# Patient Record
Sex: Male | Born: 1960
Health system: Southern US, Community
[De-identification: ages and names within clinical notes are randomized; demographics above are authoritative.]

## PROBLEM LIST (undated history)

## (undated) DIAGNOSIS — I10 Essential (primary) hypertension: Secondary | ICD-10-CM

## (undated) DIAGNOSIS — I8010 Phlebitis and thrombophlebitis of unspecified femoral vein: Secondary | ICD-10-CM

## (undated) DIAGNOSIS — Z1211 Encounter for screening for malignant neoplasm of colon: Secondary | ICD-10-CM

## (undated) DIAGNOSIS — Z125 Encounter for screening for malignant neoplasm of prostate: Secondary | ICD-10-CM

## (undated) DIAGNOSIS — IMO0002 Reserved for concepts with insufficient information to code with codable children: Secondary | ICD-10-CM

## (undated) DIAGNOSIS — E7800 Pure hypercholesterolemia, unspecified: Secondary | ICD-10-CM

## (undated) DIAGNOSIS — K219 Gastro-esophageal reflux disease without esophagitis: Secondary | ICD-10-CM

## (undated) DIAGNOSIS — E119 Type 2 diabetes mellitus without complications: Secondary | ICD-10-CM

## (undated) DIAGNOSIS — Z794 Long term (current) use of insulin: Secondary | ICD-10-CM

## (undated) HISTORY — PX: ABDOMINAL SURGERY: SHX537

---

## 2010-11-03 NOTE — Procedures (Signed)
[facility]  *** FINAL REPORT ***    Name: FELIX, MERAS  MRN: [id number]  DOB: 02-15-1961  Visit: [id number]  Date: 28 Oct 2010    TYPE OF TEST: Peripheral Venous Testing    REASON FOR TEST  Limb swelling    Right Leg:-  Deep venous thrombosis:           Yes  Proximal extent of thrombus:      Popliteal Below The Knee  Superficial venous thrombosis:    No  Deep venous insufficiency:        No  Superficial venous insufficiency: No    Left Leg:-  Deep venous thrombosis:           Yes  Proximal extent of thrombus:      Mid Femoral  Superficial venous thrombosis:    No  Deep venous insufficiency:        No  Superficial venous insufficiency: No      INTERPRETATION/FINDINGS  Right leg :  1. Deep vein(s) visualized include the common femoral, proximal  femoral, mid femoral, distal femoral, popliteal(above knee),  popliteal(fossa), popliteal(below knee) and posterior tibial veins.  2.Chronic nonocclusive deep venous thrombosis identified in the  popliteal(below knee) vein.  Left leg :  1. Deep vein(s) visualized include the common femoral, proximal  femoral, mid femoral, distal femoral, popliteal(above knee),  popliteal(fossa), popliteal(below knee) and posterior tibial veins.  2.Chronic nonocclusive deep venous thrombosis identified in the mid  femoral, distal femoral, popliteal(above knee), popliteal(fossa) and  popliteal(below knee) veins.    ADDITIONAL COMMENTS  Incidental Finding: The left superficial femoral artery is occluded  from proximal to distal segment.    I have personally reviewed the data relevant to the interpretation of  this  study.    TECHNOLOGIST:  Bess Kinds, [facility], RVT/    PHYSICIAN:  Electronically signed by:  Doyce Loose. Jakyrie Totherow MD  11/03/2010 11:42 AM

## 2010-11-03 NOTE — Procedures (Signed)
[facility]  *** FINAL REPORT ***    Name: [name], Sean  MRN: [id number]  DOB: 18 Apr 1960  Visit: [id number]  Date: 28 Oct 2010    TYPE OF TEST: Peripheral Venous Testing    REASON FOR TEST  Limb swelling    Right Leg:-  Deep venous thrombosis:           Yes  Proximal extent of thrombus:      Popliteal Below The Knee  Superficial venous thrombosis:    No  Deep venous insufficiency:        No  Superficial venous insufficiency: No    Left Leg:-  Deep venous thrombosis:           Yes  Proximal extent of thrombus:      Mid Femoral  Superficial venous thrombosis:    No  Deep venous insufficiency:        No  Superficial venous insufficiency: No      INTERPRETATION/FINDINGS  Right leg :  1. Deep vein(s) visualized include the common femoral, proximal  femoral, mid femoral, distal femoral, popliteal(above knee),  popliteal(fossa), popliteal(below knee) and posterior tibial veins.  2.Chronic nonocclusive deep venous thrombosis identified in the  popliteal(below knee) vein.  Left leg :  1. Deep vein(s) visualized include the common femoral, proximal  femoral, mid femoral, distal femoral, popliteal(above knee),  popliteal(fossa), popliteal(below knee) and posterior tibial veins.  2.Chronic nonocclusive deep venous thrombosis identified in the mid  femoral, distal femoral, popliteal(above knee), popliteal(fossa) and  popliteal(below knee) veins.    ADDITIONAL COMMENTS  Incidental Finding: The left superficial femoral artery is occluded  from proximal to distal segment.    I have personally reviewed the data relevant to the interpretation of  this  study.    TECHNOLOGIST:  Emmanuel Bathelemy, [facility], RVT/    PHYSICIAN:  Electronically signed by:  Danis Pembleton K. Austyn Seier MD  11/03/2010 11:42 AM

## 2010-12-01 LAB — CARDIAC PANEL,(CK, CKMB & TROPONIN)
CK - MB: 0.8 ng/ml (ref 0.5–3.6)
CK-MB Index: 0.6 % (ref 0.0–4.0)
CK: 127 U/L (ref 39–308)
Troponin-I, QT: <0.04 NG/ML (ref 0.00–0.06)

## 2010-12-01 LAB — HEPATIC FUNCTION PANEL
A-G Ratio: 0.9 (ref 0.8–1.7)
ALT (SGPT): 36 U/L (ref 30–65)
AST (SGOT): 13 U/L — ABNORMAL LOW (ref 15–37)
Albumin: 4.1 g/dL (ref 3.4–5.0)
Alk. phosphatase: 134 U/L (ref 50–136)
Bilirubin, direct: 0.1 MG/DL (ref 0.0–0.2)
Bilirubin, total: 0.4 MG/DL (ref 0.2–1.0)
Globulin: 4.8 g/dL — ABNORMAL HIGH (ref 2.0–4.0)
Protein, total: 8.9 g/dL — ABNORMAL HIGH (ref 6.4–8.2)

## 2010-12-01 LAB — CBC WITH AUTOMATED DIFF
ABS. BASOPHILS: 0.1 10*3/uL — ABNORMAL HIGH (ref 0.0–0.06)
ABS. EOSINOPHILS: 0.1 10*3/uL (ref 0.0–0.4)
ABS. LYMPHOCYTES: 2.5 10*3/uL (ref 0.9–3.6)
ABS. MONOCYTES: 0.5 10*3/uL (ref 0.05–1.2)
ABS. NEUTROPHILS: 4.7 10*3/uL (ref 1.8–8.0)
BASOPHILS: 1 % (ref 0–2)
EOSINOPHILS: 2 % (ref 0–5)
HCT: 45 % (ref 36.0–48.0)
HGB: 14.8 g/dL (ref 13.0–16.0)
LYMPHOCYTES: 31 % (ref 21–52)
MCH: 25.6 PG (ref 24.0–34.0)
MCHC: 32.9 g/dL (ref 31.0–37.0)
MCV: 77.9 FL (ref 74.0–97.0)
MONOCYTES: 7 % (ref 3–10)
MPV: 9.9 FL (ref 9.2–11.8)
NEUTROPHILS: 59 % (ref 40–73)
PLATELET: 290 10*3/uL (ref 135–420)
RBC: 5.78 M/uL — ABNORMAL HIGH (ref 4.70–5.50)
RDW: 17.3 % — ABNORMAL HIGH (ref 11.6–14.5)
WBC: 7.9 10*3/uL (ref 4.6–13.2)

## 2010-12-01 LAB — EKG, 12 LEAD, INITIAL
Atrial Rate: 90 {beats}/min
Calculated P Axis: 60 degrees
Calculated R Axis: 2 degrees
Calculated T Axis: 61 degrees
Diagnosis: NORMAL
P-R Interval: 180 ms
Q-T Interval: 360 ms
QRS Duration: 98 ms
QTC Calculation (Bezet): 440 ms
Ventricular Rate: 90 {beats}/min

## 2010-12-01 LAB — DRUG SCREEN, URINE
ACETAMINOPHEN: NEGATIVE
AMPHETAMINES: NEGATIVE
BARBITURATES: NEGATIVE
BENZODIAZEPINES: NEGATIVE
COCAINE: NEGATIVE
METHADONE: NEGATIVE
Methamphetamines: NEGATIVE
OPIATES: NEGATIVE
PCP(PHENCYCLIDINE): NEGATIVE
THC (TH-CANNABINOL): POSITIVE — AB
TRICYCLICS: NEGATIVE

## 2010-12-01 LAB — URINE MICROSCOPIC ONLY
Hyaline cast: 0 (ref 0–2)
RBC: 36 /HPF (ref 0–5)
WBC: 1 /HPF (ref 0–4)

## 2010-12-01 LAB — NT-PRO BNP: NT pro-BNP: 26 PG/ML (ref 0–250)

## 2010-12-01 LAB — URINALYSIS W/ RFLX MICROSCOPIC
Bilirubin: NEGATIVE
Glucose: NEGATIVE MG/DL
Leukocyte Esterase: NEGATIVE
Nitrites: NEGATIVE
Protein: 30 MG/DL — AB
Specific gravity: >1.03 — ABNORMAL HIGH (ref 1.003–1.030)
Urobilinogen: 0.2 EU/DL (ref 0.2–1.0)
pH (UA): 6 (ref 5.0–8.0)

## 2010-12-01 LAB — METABOLIC PANEL, BASIC
Anion gap: 7 mmol/L (ref 3.0–18)
BUN/Creatinine ratio: 11 — ABNORMAL LOW (ref 12–20)
BUN: 13 MG/DL (ref 7.0–18)
CO2: 28 MMOL/L (ref 21–32)
Calcium: 9.8 MG/DL (ref 8.5–10.1)
Chloride: 101 MMOL/L (ref 100–108)
Creatinine: 1.2 MG/DL (ref 0.6–1.3)
GFR est AA: >60 mL/min/{1.73_m2} (ref >60)
GFR est non-AA: >60 mL/min/{1.73_m2} (ref >60)
Glucose: 86 MG/DL (ref 74–99)
Potassium: 3.7 MMOL/L (ref 3.5–5.5)
Sodium: 136 MMOL/L (ref 136–145)

## 2010-12-01 LAB — PROTHROMBIN TIME + INR
INR: 0.9 (ref 0.0–1.2)
Prothrombin time: 12.5 s (ref 11.5–15.2)

## 2010-12-01 MED ORDER — HYDROCHLOROTHIAZIDE 12.5 MG TAB
12.5 mg | ORAL_TABLET | Freq: Every day | ORAL | Status: DC
Start: 2010-12-01 — End: 2011-08-10

## 2010-12-01 MED ORDER — HYDROCODONE-ACETAMINOPHEN 5 MG-325 MG TAB
5-325 mg | ORAL | Status: AC
Start: 2010-12-01 — End: 2010-12-01
  Administered 2010-12-01: 20:00:00 via ORAL

## 2010-12-01 MED ORDER — CLONIDINE 0.1 MG TAB
0.1 mg | ORAL | Status: AC
Start: 2010-12-01 — End: 2010-12-01
  Administered 2010-12-01: 20:00:00 via ORAL

## 2010-12-01 MED ADMIN — furosemide (LASIX) injection 40 mg: INTRAVENOUS | @ 20:00:00 | NDC 63323028004

## 2010-12-01 NOTE — ED Notes (Signed)
Pt sent from PMD office today for evaluation of elevated BP and dizziness.  PT has history of DVT and has greenfield filter in place.  Pt states has decreased smoking to 1/2 PPD.  Pt reports has leg cramping with increased with movement.  Pt conversing without difficulty, no nasal flaring, retractions, stridor or grunting.  Pt poor historian of his medical history.  Pt denies any chest pain or SOB at this time.  Pt currently in NAD.  NP at bedside evaluating patient.

## 2010-12-01 NOTE — ED Notes (Signed)
Pt reports having intermittent dizziness from headaches. Reports having high blood pressure readings lately.

## 2010-12-01 NOTE — ED Notes (Signed)
I have reviewed discharge instructions with the patient. Prescriptions were reveiwed with patient and patient instructed not to drink alcohol, drive a car, or operate heavy machinery while taking this medicine. The patient verbalized understanding. Patient seen leaving ED  Ambulatory without any difficulty, in no sign of distress. Patient armband removed and shredded

## 2010-12-01 NOTE — ED Provider Notes (Signed)
I was personally available for consultation in the emergency department.  I have reviewed the chart and agree with the documentation recorded by the MLP, including the assessment, treatment plan, and disposition.  Sueann Brownley O Landry Lookingbill

## 2010-12-01 NOTE — ED Notes (Signed)
Pt provided with healthy choice meal and ginger ale after verification for NP that pt may have.  Pt denies any needs at this time and consuming meal without difficulty.  Pt in NAD.

## 2010-12-01 NOTE — ED Notes (Signed)
Pt resting on stretcher, in NAD.  Urinal placed at bedside for use as needed.  Pt denies any request at this time.  Call bell within reach.  NP aware of pt's VS and states agrees with RN to not administer Labetalol as ordered at this time.

## 2010-12-01 NOTE — ED Notes (Signed)
"  I just left my doctor's office and my blood pressure has been high."

## 2010-12-01 NOTE — ED Provider Notes (Signed)
HPI Comments: Patient is a 50 y.o. African American male with the following medical history:   Past Medical History:    Hypertension                                                  Thromboembolus                                                Reflux                                                        Headache                                                      Greenfield filter in place                                  Presents to the ED with headache and elevated blood pressure and sent by his current doctor, Dr. 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 States that he has been having headaches routinely but the current headache started 3 days ago after an episode of CP, SOB, diaphoresis, blurry vision and near syncope on Thursday when he was at the grocery store.  States that he had to sit down, rest, drink cool water before he felt well enough to go home.  He was not seen for this episode.  When he went to his normal visit this morning his BP was 186/124 and he was sent to the ED for evaluation.  He also recently had a sleep study completed that was "terrible" per the patient who gave the report to the doctor this morning.  He has chronic cramping and burning sensation in bilateral legs for "years", has recently just completed healing on two separate wounds on his lower legs that would not heal over 6 months, and continues to smoke 1/2 pk daily but has cut down from 1 pk/day/30 years.  Family history of HTN, no DM, no known renal insufficiency, denies drug use, continues to drink caffeine and episode of CP/near syncope was first ever.         Patient is a 50 y.o. male presenting with hypertension. The history is provided by the patient. No language interpreter was used.   Hypertension    This is a new problem. The current episode started 1 to 2 hours ago. The problem has not changed since onset.Associated symptoms include chest pain (4 days ago while walking in store), orthopnea, blurred vision (occured Thursday with CP), headaches (currently on top and right side of head), peripheral edema (bilateral - chronic), dizziness (with CP on Thursday, none now)  and shortness of breath (with exertion). Pertinent negatives include no palpitations, no PND, no anxiety, patient does not experience confusion, no malaise/fatigue, no tinnitus, no neck pain, no nausea and no vomiting. There are no associated agents to hypertension. Risk factors include smoking/tobacco exposure, obesity, male gender and hypertension.        Past Medical History   Diagnosis Date   ??? Hypertension    ??? Thromboembolus    ??? Reflux    ??? Headache    ??? Greenfield filter in place         Past Surgical History   Procedure Date   ??? Pr abdomen surgery proc unlisted      ulcer in stomach removed         No family history on file.     History     Social History   ??? Marital Status: Single     Spouse Name: N/A     Number of Children: N/A   ??? Years of Education: N/A     Occupational History   ??? Not on file.     Social History Main Topics   ??? Smoking status: Current Everyday Smoker -- 0.5 packs/day   ??? Smokeless tobacco: Not on file   ??? Alcohol Use: No   ??? Drug Use: No   ??? Sexually Active:      Other Topics Concern   ??? Not on file     Social History Narrative   ??? No narrative on file                  ALLERGIES: Review of patient's allergies indicates no known allergies.      Review of Systems   Constitutional: Positive for diaphoresis and activity change (chronic inability to walk more than 5 min). Negative for fever, chills, malaise/fatigue, appetite change and fatigue.    HENT: Negative for hearing loss, nosebleeds, sore throat, facial swelling, drooling, mouth sores, trouble swallowing, neck pain, neck stiffness, dental problem, voice change, sinus pressure, tinnitus and ear discharge.    Eyes: Positive for blurred vision (occured Thursday with CP). Negative for photophobia, pain, discharge, redness and visual disturbance.   Respiratory: Positive for apnea (per recent sleep study report) and shortness of breath (with exertion). Negative for cough, choking, chest tightness, wheezing and stridor.    Cardiovascular: Positive for chest pain (4 days ago while walking in store) and orthopnea. Negative for palpitations, leg swelling and PND.   Gastrointestinal: Negative for nausea, vomiting, abdominal pain, diarrhea, constipation, blood in stool and abdominal distention.   Genitourinary: Negative for dysuria, urgency, frequency, flank pain and difficulty urinating.   Musculoskeletal: Positive for myalgias (bilateral lower legs with activity). Negative for back pain, joint swelling and gait problem.   Skin: Negative for color change, rash and wound.   Neurological: Positive for dizziness (with CP on Thursday, none now) and headaches (currently on top and right side of head). Negative for tremors, facial asymmetry, speech difficulty, weakness, light-headedness and numbness.   Hematological: Negative for adenopathy. Does not bruise/bleed easily.   Psychiatric/Behavioral: Positive for disturbed wake/sleep cycle (snores). Negative for behavioral problems, confusion and agitation. The patient is not nervous/anxious.        Filed Vitals:    12/01/10 1447 12/01/10 1544 12/01/10 1550   BP: 141/114 146/104 149/91   Pulse: 94 97 91   Temp: 99.1 ??F (37.3 ??C)     Resp: 20  19   Height: 6\' 5"  (1.956 m)  Weight: 148.326 kg (327 lb)     SpO2: 98% 97%             Physical Exam   Nursing note and vitals reviewed.   Constitutional: He is oriented to person, place, and time. He appears well-developed and well-nourished. No distress.        Central adiposity    HENT:   Head: Normocephalic and atraumatic.   Right Ear: External ear normal.   Left Ear: External ear normal.   Nose: Nose normal.   Mouth/Throat: Oropharynx is clear and moist. No oropharyngeal exudate.   Eyes: Conjunctivae and EOM are normal. Pupils are equal, round, and reactive to light. Right eye exhibits no discharge. Left eye exhibits no discharge. No scleral icterus.   Neck: Normal range of motion. Neck supple. No JVD present. No tracheal deviation present. No thyromegaly present.   Cardiovascular: Normal rate, regular rhythm and normal heart sounds.  Exam reveals no gallop and no friction rub.    No murmur heard.  Pulmonary/Chest: Effort normal and breath sounds normal. No stridor. No respiratory distress. He has no wheezes. He exhibits no tenderness.   Abdominal: Soft. Bowel sounds are normal. He exhibits no mass. There is no tenderness. There is no guarding.        No CVA tenderness     Musculoskeletal: Normal range of motion. He exhibits no edema and no tenderness.        Right knee: Normal.        Left knee: Normal.        Right ankle: Normal.        Left ankle: Normal.        Right lower leg: Normal.        Left lower leg: Normal.        Legs:  Lymphadenopathy:     He has no cervical adenopathy.   Neurological: He is alert and oriented to person, place, and time. He has normal reflexes. No cranial nerve deficit. Coordination normal.   Skin: Skin is warm and dry. No rash noted. No erythema.   Psychiatric: He has a normal mood and affect. His behavior is normal. Judgment and thought content normal.        MDM     Differential Diagnosis; Clinical Impression; Plan:     CAD, PE, CVA, AAA, hypertensive urgency/emergency, drug overdose, non compliance, stress     Consulted with Dr. Fara Olden, ED MD attending, concerning patient Julio Alm, standard discussion of reason for visit, HPI, ROS, PE, and current results available.  Recommendation for diuretic, oral clonidine and IV Labetolol to reduce BP, continue to monitor closely and obtain studies as discussed.    4:31 PM  Prior to Labetalol administration, patient's blood pressure improved dramatically with IV lasix on board and working. Pending completion labs and CT, will continue to monitor closely.  Sister from [place] needed to leave and will try to arrange a ride for the patient.       5:32 PM  Discussed results of labs/radiology with patient.  Provided contact number to [employer] who may be able to assist with transportation and follow up issues.  Mr. Minasyan feels much better and is ready to go home.   Amount and/or Complexity of Data Reviewed:   Clinical lab tests:  Ordered and reviewed  Tests in the radiology section of CPT??:  Ordered and reviewed  Tests in the medicine section of the CPT??:  Ordered and reviewed  Decide to obtain previous medical records or to obtain history from someone other than the patient:  Yes   Obtain history from someone other than the patient:  Yes   Discuss the patient with another provider:  Yes (Dr. Arnette Felts and Dr. Leonides Grills)      Procedures

## 2010-12-04 LAB — ALDOSTERONE/RENIN ACTIVITY
Aldosterone: 9.6 ng/dL (ref 0.0–30.0)
Renin Activity: 0.43 ng/mL/hr

## 2011-08-10 MED ORDER — AMLODIPINE-BENAZEPRIL 10 MG-40 MG CAP
10-40 mg | ORAL_CAPSULE | Freq: Every day | ORAL | Status: DC
Start: 2011-08-10 — End: 2011-08-18

## 2011-08-10 MED ORDER — METOPROLOL SUCCINATE SR 50 MG 24 HR TAB
50 mg | ORAL_TABLET | Freq: Every day | ORAL | Status: DC
Start: 2011-08-10 — End: 2011-08-18

## 2011-08-10 NOTE — Patient Instructions (Addendum)
1.  Your Amlodipine-Benazepril has been changed from 10-20 mg to 10-40 mg daily.    2.  Metoprolol 25 mg has been changed to 50 mg daily.    3.  Stop Clonidine medication.    4.  Get labs done Monday, 08/17/11    5.  Follow up in the office for an appointment on Tuesday, 08/18/11.    6.  Bring ALL pill bottles to appointment on 08/18/11.

## 2011-08-10 NOTE — Progress Notes (Signed)
SUBJECTIVE    Patient presents for care of uncontrolled hypertension.  He is new to our clinic.  He was referred her by his hematologist who was his prior PCP but has advised him that he should have a PCP to manage his primary care issues.  He is a poor historian and has an old list of medications from a discharge that he says he is still taking. He reports chronic DVTs that are managed by Dr. Arnette Felts.  He says that he has a greenfield filter.      We reviewed their cardiac risk factors.  He is limited with respect to exercise.  The patient says that he takes Toprol XL 25mg  daily, Lotrel 10/20mg  daily, and clonidine (immediate acting) 0.1mg  in the morning only.  He says that lately he has been having headaches.  He attributes them to his high blood pressure.  He says he took his pills this morning and denies any side effects with the medication.  The patient denies any chest pain or chest tightness.  Denies any dyspnea upon exertion.  The patient denies any headaches, blurred vision, or double vision.      He says that he has pain with swallowing foods on the left side of his throat.  He reports a history of stomach ulcers and says he had surgery for this in the past.  He denies any melena or BRBPR.  He denies ever having had a colonoscopy.  He denies any current abdominal pains.    ROS:  Complete 10 system ROS is obtained from the patient intake forms which are reviewed with the patient.  The intake H&P is scanned in for the chart.      OBJECTIVE    Blood pressure 154/90, pulse 78, temperature 98.4 ??F (36.9 ??C), temperature source Oral, resp. rate 16, height 6\' 5"  (1.956 m), weight 338 lb (153.316 kg), SpO2 98.00%.  General:  Alert, cooperative, well appearing, in no apparent distress.  Eyes:  Pupils are equally round and reactive to light with accommodation.  The extra-ocular movements are intact.   Neck:  There are no carotid bruits.  No JVD.  No masses.  No thyromegaly.  Full ROM.    CV:  The heart sounds are  regular in rate and rhythm.  There is a normal S1 and S2.  There or no murmurs.  Lungs:  Inspiratory and expiratory efforts are full and unlabored.  Lung sounds are clear and equal to auscultation throughout all lung fields without wheezing, rales, or rhonchi.  Abd: soft, nontender, no masses. No HSM, normoactive bowel sounds.  Ext: no swelling, cyanosis, or edema.  Neuro:  No deficits on gross exam.  CN 2-12 grossly intact.  Normal gait.    Psych: normal affect.  Mood good.  Oriented x 3.  Judgement and insight intact.     ASSESSMENT / PLAN    1. HTN (hypertension)  amLODIPine-benazepril (LOTREL) 10-40 mg per capsule, metoprolol-XL (TOPROL-XL) 50 mg XL tablet, LIPID PANEL, METABOLIC PANEL, COMPREHENSIVE   2. Headache     3. DVT (deep venous thrombosis)     4. Obesity     5. Pain on swallowing  REFERRAL TO GASTROENTEROLOGY   6. Colon cancer screening  REFERRAL TO GASTROENTEROLOGY   7. GERD (gastroesophageal reflux disease)     8. History of ulcer disease     9. Insomnia     10. Smoker     11. Chronic pain  His blood pressure is uncontrolled.  I do not think it is a good idea to be on clonidine immediate acting daily.  I will stop this for now and increase him to Toprol XL 50mg  daily and to Lotrel 10/40mg  daily.  I want to assess his potassium and creatinine in 1 week to ensure no renal toxicity.  I will see him in the office the following day after his labs (Tuesday).  I am coming in for a shortened day to see him even though I am on vacation.       We will monitor his headaches after the medication changes.      Continue follow-up with Dr. Arnette Felts.       It is unclear who is managing his Ambien and pain medications.  I have forewarned that this is not my area of specialty.  Also, I do not typically prescribe Ambien for patients.  We will have to see if he requests this from [place].  If so, I would suggest a trial of other medications first.        Refer to GI for pain with swallowing and for colorectal cancer  screening.  Continue PPI.     Advised on smoking cessation.     I have discussed with this patient that I consider him to be high risk for a coronary or cerebrovascular event and thus we need to see him to get his risk factors under control.      Patient (and parent or guardian if applicable) understands our medical plan.  Alternatives have been explained and offered.  Risks/benefits and significant side effects of medications have been reviewed.  Patient (and parent or guardian if applicable) encouraged to review package inserts for any medications.  All questions answered.  The patient (or parent or guardian if applicable)  is to call if condition worsens or fails to improve or if significant side effects are experienced.      We will see him back in 8 days, labs to be done 1 day prior to follow-up.

## 2011-08-18 LAB — LIPID PANEL
Cholesterol, total: 310 mg/dL — ABNORMAL HIGH (ref 100–199)
HDL Cholesterol: 47 mg/dL (ref >39)
LDL, calculated: 202 mg/dL — ABNORMAL HIGH (ref 0–99)
Triglyceride: 305 mg/dL — ABNORMAL HIGH (ref 0–149)
VLDL, calculated: 61 mg/dL — ABNORMAL HIGH (ref 5–40)

## 2011-08-18 LAB — METABOLIC PANEL, COMPREHENSIVE
A-G Ratio: 1.5 (ref 1.1–2.5)
ALT (SGPT): 15 IU/L (ref 0–44)
AST (SGOT): 13 IU/L (ref 0–40)
Albumin: 4.5 g/dL (ref 3.5–5.5)
Alk. phosphatase: 104 IU/L (ref 25–150)
BUN/Creatinine ratio: 13 (ref 9–20)
BUN: 13 mg/dL (ref 6–24)
Bilirubin, total: 0.3 mg/dL (ref 0.0–1.2)
CO2: 21 mmol/L (ref 19–28)
Calcium: 9.9 mg/dL (ref 8.7–10.2)
Chloride: 102 mmol/L (ref 97–108)
Creatinine: 0.98 mg/dL (ref 0.76–1.27)
GFR est non-AA: 89 mL/min/{1.73_m2} (ref >59)
GLOBULIN, TOTAL: 3 g/dL (ref 1.5–4.5)
Glucose: 99 mg/dL (ref 65–99)
Potassium: 4 mmol/L (ref 3.5–5.2)
Protein, total: 7.5 g/dL (ref 6.0–8.5)
Sodium: 141 mmol/L (ref 134–144)
eGFR If African American: 103 mL/min/{1.73_m2} (ref >59)

## 2011-08-18 MED ORDER — ATORVASTATIN 20 MG TAB
20 mg | ORAL_TABLET | Freq: Every day | ORAL | Status: DC
Start: 2011-08-18 — End: 2011-10-28

## 2011-08-18 MED ORDER — METOPROLOL SUCCINATE SR 100 MG 24 HR TAB
100 mg | ORAL_TABLET | Freq: Every day | ORAL | Status: DC
Start: 2011-08-18 — End: 2011-12-01

## 2011-08-18 NOTE — Patient Instructions (Addendum)
1. You should be on Amlodipine-Benazepril 10-20 mg daily.  Do not take the Amlodipine-Benazepril 10-40 mg.    2. Take Metoprolol 100 mg everyday.     3. Do not take the Clonidine medication.    4. Follow up in 6 weeks (10/05/11)  for an appointment.    5. Labs are due 09/29/11.    6. The sleep specialist will call you to schedule an appointment.

## 2011-08-23 NOTE — Progress Notes (Addendum)
SUBJECTIVE    Patient presents for follow-up on his labs.  He is not taking his medications as they are prescribed.   He was given clear written directions but he is not following them.  He is taking his clonidine as needed. He says that he did not take it today.  He is only taking it once daily at times.  He is taking the Toprol 50mg  XL that we increased (or he reported the incorrect dose to [place] at his first visit as 25mg ).  He is not taking the increased dose of Lotrel 10/40.  Instead he is taking Lotrel 10/20.  He denies any side effects with the medication.  The patient denies any chest pain or chest tightness.  Denies any dyspnea upon exertion.  The patient denies any headaches, blurred vision, or double vision.      He is concerned today about his blood pressure being well-controlled. He thinks that he has headaches when it is elevated.  Hypertension was the main reason he was referred to get a PCP.  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  He does ask if we will manage his pain medications because he is unsure if his hematologist will continue to prescribe them.  He says he has right foot pain that is due to a corn.  He says that it was removed by a podiatrist but it hurt too much when it was done and does not want to do that again.      OBJECTIVE    Blood pressure 140/78, pulse 78, temperature 98.6 ??F (37 ??C), temperature source Oral, resp. rate 16, height 6\' 5"  (1.956 m), weight 336 lb (152.409 kg), SpO2 98.00%.  General:  Alert, cooperative, well appearing, in no apparent distress.  CV:  The heart sounds are regular in rate and rhythm.  There is a normal S1 and S2.  There or no murmurs.  Lungs:  Inspiratory and expiratory efforts are full and unlabored.  Lung sounds are clear and equal to auscultation throughout all lung fields without wheezing, rales, or rhonchi.  Ext: no swelling.  Peripheral pulses are present and equal.   Skin: there is a large callused  corn on the lateral aspect of the right midfoot.There are no LE skin color changes.  Normal turgor and temperature.   Psych: normal affect.  Mood good.  Oriented x 3.  Judgement and insight intact.     Results for orders placed in visit on 08/10/11   METABOLIC PANEL, COMPREHENSIVE       Component Value Range    Glucose 99  65 - 99 mg/dL    BUN 13  6 - 24 mg/dL    Creatinine 1.61  0.96 - 1.27 mg/dL    GFR est non-AA 89  >04 mL/min/1.73    eGFR If Africn Am 103  >59 mL/min/1.73    BUN/Creatinine ratio 13  9 - 20    Sodium 141  134 - 144 mmol/L    Potassium 4.0  3.5 - 5.2 mmol/L    Chloride 102  97 - 108 mmol/L    CO2 21  19 - 28 mmol/L    Calcium 9.9  8.7 - 10.2 mg/dL    Protein, total 7.5  6.0 - 8.5 g/dL    Albumin 4.5  3.5 - 5.5 g/dL    GLOBULIN, TOTAL 3.0  1.5 - 4.5 g/dL    A-G Ratio 1.5  1.1 - 2.5  Bilirubin, total 0.3  0.0 - 1.2 mg/dL    Alk. phosphatase 104  25 - 150 IU/L    AST 13  0 - 40 IU/L    ALT 15  0 - 44 IU/L   LIPID PANEL       Component Value Range    Cholesterol, total 310 (*) 100 - 199 mg/dL    Triglyceride 161 (*) 0 - 149 mg/dL    HDL Cholesterol 47  >39 mg/dL    VLDL, calculated 61 (*) 5 - 40 mg/dL    LDL, calculated 096 (*) 0 - 99 mg/dL     ASSESSMENT / PLAN    1. HTN (hypertension)  amLODIPine-benazepril (LOTREL) 10-20 mg per capsule, metoprolol-XL (TOPROL-XL) 100 mg XL tablet   2. Hypercholesterolemia  atorvastatin (LIPITOR) 20 mg tablet, HEPATIC FUNCTION PANEL, LIPID PANEL   3. Chronic leg pain     4. Corn of foot     5. Sleep apnea  REFERRAL TO SLEEP STUDIES   6. Insomnia  REFERRAL TO SLEEP STUDIES       I reviewed his labs which show elevated cholesterol.  He does want to treat this aggressively.  I am concerned about his ability to comply with follow-up labs, medication directions, etc.  He will start Lipitor 20mg  daily and we will recheck his numbers in [redacted] weeks along with his LFTs to assess for hepatotoxicity.       With respect to his HTN, we will discontinue his clonidine as I think it  is dangerous for him to take it as needed.  He will increase to Toprol XL 100mg .  He will stay on Lotrel 10/20mg  daily and we called the pharmacy to cancel his old order for 10/40.       I offered a referral to a podiatrist but he declines.      Refer to a sleep specialist to discuss his prior work-up for sleep apnea.  Also, I do not prescribe Ambien so I would hope that the sleep specialist will take care of all aspects of disordered sleep for this patient as often times they only address sleep apnea and the patients return to the PCP for Ambien.  This is not comprehensive appropriate management.  We will send this note so that they can address his needs and if they cannot, we can choose a sleep specialist that can.      I have asked the patient to discuss pain management with his hematologist.  If he will not treat his chronic pain from DVT, then he will be referred to pain management.     Patient (and parent or guardian if applicable) understands our medical plan. Patient has provided input and agrees with goals.  Alternatives have been explained and offered.  Risks/benefits and significant side effects of medications have been reviewed.  Patient (and parent or guardian if applicable) encouraged to review package inserts for any medications.  All questions answered.  The patient (or parent or guardian if applicable)  is to call if condition worsens or fails to improve or if significant side effects are experienced.     Follow-up Disposition:  Return in about 6 weeks (around 09/29/2011) for cholesterol.

## 2011-08-24 NOTE — Telephone Encounter (Addendum)
LMOV [phone] (home)  for patient to call office.  He was asked on voicemail to have medication bottles in front of him when he calls back. When call is returned will review medications with patient.

## 2011-08-25 NOTE — Telephone Encounter (Signed)
LMOV [phone] (home)  for patient to call office.

## 2011-08-26 NOTE — Telephone Encounter (Signed)
LMOV [phone] (home) for patient to call office.

## 2011-08-28 NOTE — Telephone Encounter (Signed)
LMOV [phone] (home)  for patient to call office.

## 2011-08-31 NOTE — Telephone Encounter (Signed)
LMOV [phone] (home)  for patient to call office.

## 2011-09-01 NOTE — Telephone Encounter (Signed)
Letter sent.

## 2011-09-30 LAB — HEPATIC FUNCTION PANEL
ALT (SGPT): 16 IU/L (ref 0–44)
AST (SGOT): 11 IU/L (ref 0–40)
Albumin: 4.2 g/dL (ref 3.5–5.5)
Alk. phosphatase: 96 IU/L (ref 44–102)
Bilirubin, direct: 0.07 mg/dL (ref 0.00–0.40)
Bilirubin, total: 0.2 mg/dL (ref 0.0–1.2)
Protein, total: 7.2 g/dL (ref 6.0–8.5)

## 2011-09-30 LAB — LIPID PANEL
Cholesterol, total: 280 mg/dL — ABNORMAL HIGH (ref 100–199)
HDL Cholesterol: 44 mg/dL (ref >39)
LDL, calculated: 176 mg/dL — ABNORMAL HIGH (ref 0–99)
Triglyceride: 299 mg/dL — ABNORMAL HIGH (ref 0–149)
VLDL, calculated: 60 mg/dL — ABNORMAL HIGH (ref 5–40)

## 2011-09-30 LAB — CVD REPORT: PDF IMAGE: 0

## 2011-10-22 NOTE — Progress Notes (Signed)
Quick Note:    Nurse to advise patient:    Patient is due for follow-up to review these results. His cholesterol is still elevated and need to discuss our recommendations. Advise to bring meds to office visit.  ______

## 2011-10-23 NOTE — Progress Notes (Signed)
Quick Note:    Spoke with patient. He was advised that a follow up is needed to review labs. He was instructed to bring medication bottles to appointment.  ______

## 2011-10-29 NOTE — Progress Notes (Signed)
Quick Note:    Patient's labs reviewed at follow up on 10/28/11.  ______

## 2011-10-31 NOTE — Progress Notes (Signed)
SUBJECTIVE    Patient presents for follow-up on his repeat cholesterol labs and hypertension.  He is not taking his cholesterol medications as they have run out.  He is now correctly taking his blood pressure pills.   Says he feels better now on them and that his blood pressures are not spiking up like before.   He denies any side effects with the medication.   Says that he tolerated the statin well without myalgias or cramping.  The patient denies any chest pain or chest tightness.  Denies any dyspnea upon exertion.  The patient denies any headaches, blurred vision, or double vision.      OBJECTIVE    Blood pressure 156/100, pulse 99, temperature 98.5 ??F (36.9 ??C), temperature source Oral, resp. rate 16, height 6\' 5"  (1.956 m), weight 351 lb (159.213 kg), SpO2 96.00%.  General:  Alert, cooperative, well appearing, in no apparent distress.  Vasc:  No swelling.  No carotid bruits.  No JVD.  CV:  The heart sounds are regular in rate and rhythm.  There is a normal S1 and S2.  There or no murmurs.  Lungs:  Inspiratory and expiratory efforts are full and unlabored.  Lung sounds are clear and equal to auscultation throughout all lung fields without wheezing, rales, or rhonchi.  Neuro:  Gait is normal.  Balance intact.  No deficits.   Psych: normal affect.  Mood good.  Oriented x 3.  Judgement and insight intact.     Results for orders placed in visit on 08/18/11   HEPATIC FUNCTION PANEL       Component Value Range    Protein, total 7.2  6.0 - 8.5 g/dL    Albumin 4.2  3.5 - 5.5 g/dL    Bilirubin, total 0.2  0.0 - 1.2 mg/dL    Bilirubin, direct 4.54  0.00 - 0.40 mg/dL    Alk. phosphatase 96  44 - 102 IU/L    AST 11  0 - 40 IU/L    ALT 16  0 - 44 IU/L   LIPID PANEL       Component Value Range    Cholesterol, total 280 (*) 100 - 199 mg/dL    Triglyceride 098 (*) 0 - 149 mg/dL    HDL Cholesterol 44  >39 mg/dL    VLDL, calculated 60 (*) 5 - 40 mg/dL    LDL, calculated 119 (*) 0 - 99 mg/dL   CVD REPORT       Component Value  Range    INTERPRETATION Note      PDF IMAGE .       ASSESSMENT / PLAN    1. HTN (hypertension)  METABOLIC PANEL, BASIC, HEPATIC FUNCTION PANEL, METABOLIC PANEL, COMPREHENSIVE   2. Hypercholesterolemia  HEPATIC FUNCTION PANEL, METABOLIC PANEL, COMPREHENSIVE, LIPID PANEL   3. Obesity         I reviewed his labs which show an improvement but persistent elevated cholesterol.  He will increase to Lipitor 40mg  daily and we will recheck his numbers in [redacted] weeks along with his LFTs to assess for hepatotoxicity.       With respect to his HTN, I it still uncontrolled.  We will continueToprol XL 100mg .  He will increase to Lotrel 10/40mg  daily and we will recheck his BMP to assess for renal toxicity on the increase dose of the ace inhibitor.       Counseled on diet and exercise.     Patient understands our medical plan. Patient has  provided input and agrees with goals.  Alternatives have been explained and offered.  Risks/benefits and significant side effects of medications have been reviewed.  Patient encouraged to review package inserts for any medications.  All questions answered.  The patient is to call if condition worsens or fails to improve.     Follow-up Disposition:  Return in about 7 weeks (around 12/16/2011) for blood pressure and cholesterol.

## 2011-11-16 ENCOUNTER — Inpatient Hospital Stay: Payer: MEDICARE

## 2011-11-16 MED ADMIN — lactated ringers infusion: INTRAVENOUS | @ 14:00:00 | NDC 00409795309

## 2011-11-16 MED ADMIN — famotidine (PF) (PEPCID) injection 20 mg: INTRAVENOUS | @ 14:00:00 | NDC 00641602201

## 2011-11-16 MED FILL — FAMOTIDINE (PF) 20 MG/2 ML IV: 20 mg/2 mL | INTRAVENOUS | Qty: 2

## 2011-11-16 MED FILL — LACTATED RINGERS IV: INTRAVENOUS | Qty: 1000

## 2011-11-16 NOTE — Procedures (Signed)
See Scanned notes from procedure done today.

## 2011-11-16 NOTE — H&P (Signed)
Chief Complaint: Dysphagia with GERD and for CRCS.    History of present illness: Never had colonoscopy.     PMH:   Past Medical History   Diagnosis Date   ??? Hypertension    ??? Reflux    ??? 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 DVT (deep vein thrombosis)    ??? Thromboembolus      last one 5 to 6 months ago   ??? Unspecified sleep apnea      no CPAP   ??? GERD (gastroesophageal reflux disease)    ??? Sore throat x 2 months     that comes and goes   ??? High cholesterol      Allergies: No Known Allergies  Medications: Current facility-administered medications:lactated ringers infusion, 75 mL/hr, IntraVENous, CONTINUOUS, Fabio K Higuchi, CRNA, Last Rate: 75 mL/hr at 11/16/11 0945, 75 mL/hr at 11/16/11 0945;  famotidine (PF) (PEPCID) injection 20 mg, 20 mg, IntraVENous, ONCE, Fabio K Higuchi, CRNA, 20 mg at 11/16/11 0945  FH:   Family History   Problem Relation Age of Onset   ??? Heart Attack Mother    ??? Hypertension Mother    ??? Arthritis-rheumatoid Mother    ??? Heart Failure Mother    ??? High Cholesterol Mother    ??? Hypertension Father    ??? Alcohol abuse Father    ??? Other Father      Tobacco Use     Social:   History     Social History   ??? Marital Status: SINGLE     Spouse Name: N/A     Number of Children: N/A   ??? Years of Education: N/A     Occupational History   ??? disability      Social History Main Topics   ??? Smoking status: Current Everyday Smoker -- 0.8 packs/day for 15 years     Types: Cigarettes   ??? Smokeless tobacco: Never Used   ??? Alcohol Use: No   ??? Drug Use: No   ??? Sexually Active: Yes -- Male partner(s)     Other Topics Concern   ??? Not on file     Social History Narrative   ??? No narrative on file     Surgical H:   Past Surgical History   Procedure Date   ??? Pr abdomen surgery proc unlisted      ulcer in stomach removed   ??? Hx other surgical 3 years ago     Port A Cath, inserted and removed       ROS: positive for dysphagia and reflux symptoms    Physical Exam: BP 107/77   Pulse 106   Temp 98.3 ??F (36.8 ??C)   Resp 22   Ht 6\' 5"   (1.956 m)   Wt 160.573 kg (354 lb)   BMI 41.98 kg/m2   SpO2 99%  General appearance: alert, no distress  Eyes: pupils equal and reactive, extraocular eye movements intact  Nodes: No gross adenopathy in neck.  Skin: no spider angiomata, jaundice, palmar erythema   Respiratory: clear to auscultation bilaterally  Cardiovascular: regular heart rate, no murmurs, no JVD, normal rate and regular rhythm  Abdomen: soft, non-tender, liver not enlarged, spleen not palpable, no obvious ascites  Extremities: no muscle wasting, no gross arthritic changes  Neurologic: alert and oriented, cranial nerves grossly intact, no asterixis    Labs: No results found for this or any previous visit (from the past 24 hour(s)).      Imp/ Plan: Will  proceed with EGD and COLO  as planned. Risk benefits alternative including but not limited to infection, bleeding, perforation of viscous, allergic reaction and resultant morbidity and mortality was discussed. Chance of missing a significant lesion due to various reasons were discussed.      Caroleen Hamman MD  [facility]

## 2011-11-16 NOTE — Other (Signed)
Post-Anesthesia Evaluation & Assessment    Visit Vitals   Item Reading   ??? BP 119/61   ??? Pulse 80   ??? Temp 98.3 ??F (36.8 ??C)   ??? Resp 14   ??? Ht 6\' 5"  (1.956 m)   ??? Wt 160.573 kg (354 lb)   ??? BMI 41.98 kg/m2   ??? SpO2 99%       Nausea/Vomiting: no nausea    Post-operative hydration adequate.    Patient rates pain 0     Mental status & Level of consciousness: alert and oriented x 3    Neurological status: moves all extremities, sensation grossly intact    Pulmonary status: airway patent, no supplemental oxygen required    Complications related to anesthesia: none    Patient has met all discharge requirements.    Additional comments:        Criselda Peaches, MD  @DATE @  .12:07 PM

## 2011-11-23 MED FILL — DIPRIVAN 10 MG/ML INTRAVENOUS EMULSION: 10 mg/mL | INTRAVENOUS | Qty: 20

## 2011-11-23 MED FILL — XYLOCAINE-MPF 20 MG/ML (2 %) INJECTION SOLUTION: 20 mg/mL (2 %) | INTRAMUSCULAR | Qty: 1

## 2011-11-27 NOTE — Telephone Encounter (Signed)
Spoke with patient's sister, Alvira Philips, she was advised that patient needs labs done tomorrow for 12/01/11 appointment.  She and patient were on the phone and she stated she will take him in the morning to have them completed.

## 2011-11-27 NOTE — Telephone Encounter (Signed)
Spoke with patient.  He was advised that he is due for fasting labs prior to 12/01/11 appointment. He states he will contact office when he gets home to determine if appointment needs to be rescheduled or if he will be able to have labs done prior to appointment.

## 2011-11-30 LAB — METABOLIC PANEL, COMPREHENSIVE
A-G Ratio: 1.3 (ref 1.1–2.5)
ALT (SGPT): 17 IU/L (ref 0–44)
AST (SGOT): 16 IU/L (ref 0–40)
Albumin: 4.3 g/dL (ref 3.5–5.5)
Alk. phosphatase: 106 IU/L — ABNORMAL HIGH (ref 44–102)
BUN/Creatinine ratio: 16 (ref 9–20)
BUN: 14 mg/dL (ref 6–24)
Bilirubin, total: 0.2 mg/dL (ref 0.0–1.2)
CO2: 22 mmol/L (ref 19–28)
Calcium: 9.9 mg/dL (ref 8.7–10.2)
Chloride: 99 mmol/L (ref 97–108)
Creatinine: 0.9 mg/dL (ref 0.76–1.27)
GFR est AA: 114 mL/min/{1.73_m2} (ref >59)
GFR est non-AA: 99 mL/min/{1.73_m2} (ref >59)
GLOBULIN, TOTAL: 3.2 g/dL (ref 1.5–4.5)
Glucose: 117 mg/dL — ABNORMAL HIGH (ref 65–99)
Potassium: 3.7 mmol/L (ref 3.5–5.2)
Protein, total: 7.5 g/dL (ref 6.0–8.5)
Sodium: 138 mmol/L (ref 134–144)

## 2011-11-30 LAB — LIPID PANEL
Cholesterol, total: 254 mg/dL — ABNORMAL HIGH (ref 100–199)
HDL Cholesterol: 45 mg/dL (ref >39)
LDL, calculated: 146 mg/dL — ABNORMAL HIGH (ref 0–99)
Triglyceride: 313 mg/dL — ABNORMAL HIGH (ref 0–149)
VLDL, calculated: 63 mg/dL — ABNORMAL HIGH (ref 5–40)

## 2011-11-30 LAB — CVD REPORT: PDF IMAGE: 0

## 2011-12-01 NOTE — Progress Notes (Signed)
SUBJECTIVE    Patient presents for follow-up on his repeat cholesterol labs and hypertension.  We have adjusted his medications at the prior visit.  He now reports full compliance with his medications.  Says that he feels great.  Home BPs are normal.  He denies any side effects with the medication.   Says that he tolerated the statin well without myalgias or cramping.  The patient denies any chest pain or chest tightness.  Denies any dyspnea upon exertion.  The patient denies any blurred vision or double vision.  Headaches are a lot better as well.      Chronic DVTs - sees hematology but he has changed insurance and he has to thus change hematologists.  His former hematologist was managing the chronic pain he gets from the DVTs with Percocet and the insomnia as a result with Ambien.     OBJECTIVE    Blood pressure 126/86, pulse 81, temperature 98.7 ??F (37.1 ??C), temperature source Oral, resp. rate 16, height 6\' 5"  (1.956 m), weight 357 lb (161.934 kg), SpO2 95.00%.  General:  Alert, cooperative, well appearing, in no apparent distress.  Vasc:  No swelling.  No carotid bruits.  No JVD.  CV:  The heart sounds are regular in rate and rhythm.  There is a normal S1 and S2.  There or no murmurs.  Lungs:  Inspiratory and expiratory efforts are full and unlabored.  Lung sounds are clear and equal to auscultation throughout all lung fields without wheezing, rales, or rhonchi.  Neuro:  Gait is normal.  Balance intact.  No deficits.   Psych: normal affect.  Mood good.  Oriented x 3.  Judgement and insight intact.     Results for orders placed in visit on 10/28/11   METABOLIC PANEL, COMPREHENSIVE       Result Value Range    Glucose 117 (*) 65 - 99 mg/dL    BUN 14  6 - 24 mg/dL    Creatinine 0.98  1.19 - 1.27 mg/dL    GFR est non-AA 99  >14 mL/min/1.73    GFR est AA 114  >59 mL/min/1.73    BUN/Creatinine ratio 16  9 - 20    Sodium 138  134 - 144 mmol/L    Potassium 3.7  3.5 - 5.2 mmol/L    Chloride 99  97 - 108 mmol/L    CO2 22   19 - 28 mmol/L    Calcium 9.9  8.7 - 10.2 mg/dL    Protein, total 7.5  6.0 - 8.5 g/dL    Albumin 4.3  3.5 - 5.5 g/dL    GLOBULIN, TOTAL 3.2  1.5 - 4.5 g/dL    A-G Ratio 1.3  1.1 - 2.5    Bilirubin, total 0.2  0.0 - 1.2 mg/dL    Alk. phosphatase 106 (*) 44 - 102 IU/L    AST 16  0 - 40 IU/L    ALT 17  0 - 44 IU/L   LIPID PANEL       Result Value Range    Cholesterol, total 254 (*) 100 - 199 mg/dL    Triglyceride 782 (*) 0 - 149 mg/dL    HDL Cholesterol 45  >39 mg/dL    VLDL, calculated 63 (*) 5 - 40 mg/dL    LDL, calculated 956 (*) 0 - 99 mg/dL   CVD REPORT       Result Value Range    INTERPRETATION Note      PDF IMAGE .  ASSESSMENT / PLAN  1. HTN (hypertension)  metoprolol-XL (TOPROL-XL) 100 mg XL tablet, amLODIPine-benazepril (LOTREL) 10-40 mg per capsule   2. Hypercholesterolemia  atorvastatin (LIPITOR) 40 mg tablet   3. Obesity     4. IFG (impaired fasting glucose)     5. DVT (deep venous thrombosis)         I reviewed his labs which show an improvement but persistent elevated cholesterol.  He wants to continue Lipitor 40mg  daily at this point since he feels his diet is poor.  He is counseled on diet and exercise as tolerated.  We will recheck his numbers in 6 months along with his LFTs to assess for hepatotoxicity.       With respect to his HTN, it is better controlled.  We will continueToprol XL 100mg .  He will continue Lotrel 10/40mg  daily.  He has not had any renal toxicity which we have been monitoring.       IFG - have added an A1c to the labs he already got.  We will await the results.      Counseled on diet and exercise.      Issues related to his DVT will hopefully continue to be managed by his new hematologist (pain and insomnia).  If not, we will have to bridge the gap and refer him to pain management.     Patient understands our medical plan. Patient has provided input and agrees with goals.  Alternatives have been explained and offered.  Risks/benefits and significant side effects of medications  have been reviewed.  Patient encouraged to review package inserts for any medications.  All questions answered.  The patient is to call if condition worsens or fails to improve.      Follow-up in 6 months, fasting labs prior.

## 2012-08-31 ENCOUNTER — Encounter

## 2012-09-15 NOTE — Patient Instructions (Signed)
Follow up in 1 month for routine care.    Bring med bottles to next appointment.      [facility]  [address]  [place], [place] [address]  [phone]  Appointment: 10/13/12 @ 10:00 with Dr. Thomasena Edis.

## 2012-09-15 NOTE — Progress Notes (Signed)
This is an Initial Medicare Annual Wellness Exam (AWV) (Performed 12 months after IPPE or effective date of Medicare Part B enrollment, Once in a lifetime)    I have reviewed the patient's medical history in detail and updated the computerized patient record.     History     Past Medical History   Diagnosis Date   ??? Hypertension    ??? 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 DVT (deep venous thrombosis)    ??? Thromboembolus      last one 5 to 6 months ago   ??? Unspecified sleep apnea      no CPAP   ??? GERD (gastroesophageal reflux disease)    ??? High cholesterol    ??? S/P colonoscopy with polypectomy 11/16/11     Dr. Eulogio Ditch; polyps x 3; otherwise normal colonoscopy; f/u with gastro in 1 year   ??? H/O esophagogastroduodenoscopy 11/16/11     Dr. Eulogio Ditch; normal stomach and duodenum; hiatal hernia in the cardia   ??? History of stress test 06/2012     [facility] - negative stress ECHO   ??? Cyst of left kidney      referred to urology      Past Surgical History   Procedure Laterality Date   ??? Pr abdomen surgery proc unlisted       ulcer in stomach removed   ??? Hx other surgical  3 years ago     Port A Cath, inserted and removed     Current Outpatient Prescriptions   Medication Sig Dispense Refill   ??? atorvastatin (LIPITOR) 40 mg tablet Take 1 Tab by mouth daily.  30 Tab  5   ??? amLODIPine-benazepril (LOTREL) 10-40 mg per capsule Take 1 Cap by mouth daily.  30 Cap  5   ??? oxyCODONE-acetaminophen (PERCOCET) 5-325 mg per tablet Take 1 Tab by mouth every four (4) hours as needed.         ??? zolpidem (AMBIEN) 10 mg tablet Take 10 mg by mouth nightly as needed.       ??? omeprazole (PRILOSEC) 20 mg capsule Take 20 mg by mouth daily.         ??? metoprolol-XL (TOPROL-XL) 100 mg XL tablet Take 1 Tab by mouth daily.  30 Tab  5   ??? fondaparinux (ARIXTRA) 10 mg/0.8 mL Syrg 0.8 mL by SubCUTAneous route daily. To stop the day before the procedure, patient states per MD. Resume the medication after one week.  10 Syringe  1     No Known Allergies  Family History    Problem Relation Age of Onset   ??? Heart Attack Mother    ??? Hypertension Mother    ??? Arthritis-rheumatoid Mother    ??? Heart Failure Mother    ??? High Cholesterol Mother    ??? Hypertension Father    ??? Alcohol abuse Father    ??? Other Father      Tobacco Use     History   Substance Use Topics   ??? Smoking status: Current Every Day Smoker -- 0.80 packs/day for 15 years     Types: Cigarettes   ??? Smokeless tobacco: Never Used   ??? Alcohol Use: No     Patient Active Problem List   Diagnosis Code   ??? HTN (hypertension) 401.9   ??? DVT (deep venous thrombosis) 453.40   ??? Hypercholesterolemia 272.0   ??? Obesity 278.00   ??? Other dysphagia 787.29   ??? Special screening for malignant neoplasms,  colon V76.51         Depression Risk Factor Screening:     PHQ 2 / 9, over the last two weeks 09/15/2012   Little interest or pleasure in doing things Several days   Feeling down, depressed or hopeless Several days   Total Score PHQ 2 2   Says that this is due to his health mainly.  No harmful ideation.  Declines any referrals or pharmacological intervention.     Alcohol Risk Factor Screening:   On any occasion during the past 3 months, have you had more than 4 drinks containing alcohol?  No    Do you average more than 14 drinks per week?  No    Functional Ability and Level of Safety:     Hearing Loss   denies    Activities of Daily Living   Self-care.   Requires assistance with: no ADLs    Fall Risk     Abuse Screen   Patient is not abused    Review of Systems   A comprehensive review of systems was negative except for that written in the HPI.    Physical Examination       Evaluation of Cognitive Function:  Mood/affect:  happy  Appearance: age appropriate and casually dressed  Family member/caregiver input: son    Blood pressure 156/92, pulse 106, temperature 98.3 ??F (36.8 ??C), temperature source Oral, resp. rate 18, height 6\' 5"  (1.956 m), weight 345 lb (156.491 kg), SpO2 98.00%.   General:  Alert, cooperative, well appearing, in no apparent  distress.  HEENT: no oral lesions.    Vasc:  No swelling.  No carotid bruits.  No JVD.  CV:  The heart sounds are regular in rate and rhythm.  There is a normal S1 and S2.  There or no murmurs.  Lungs:  Inspiratory and expiratory efforts are full and unlabored.  Lung sounds are clear and equal to auscultation throughout all lung fields without wheezing, rales, or rhonchi.  Declines prostate exam.  Neuro:  Gait is normal.  Balance intact.  No deficits.   Psych: normal affect.  Mood good.  Oriented x 3.  Judgement and insight intact.     Patient Care Team:  Amil Amen, MD as PCP - General (Family Practice)  Rayburn Felt (Internal Medicine)  Laney Pastor, MD (Gastroenterology)  Francina Ames, MD (Orthopedic Surgery)    Advice/Referrals/Counseling   Education and counseling provided:  Are appropriate based on today's review and evaluation  End-of-Life planning (with patient's consent)    Assessment/Plan     1. Routine general medical examination at a health care facility V70.0    2. Screening for depression V79.0      Age and sex specific counseling.    Screening for depression and alcoholism.     Advanced directives / end of life planning discussion and packet provided for review at home.     Care team updated.      Offered smoking cessation appointment.     Body mass index is 40.9 kg/(m^2). Offered dietary consultation for obesity.    Recommend pneumonia vaccine.   Declines.     Medicare recommended screening and prevention test table is filled out, reviewed, and provided to patient.  Scanned to chart as well.      Patient understands our medical plan. Patient has provided input and agrees with goals.  All questions answered.

## 2012-09-15 NOTE — Progress Notes (Signed)
SUBJECTIVE  Chief Complaint   Patient presents with   ??? Annual Wellness Visit   ??? Follow Up Chronic Condition     Chronically medically noncomplaint patient here for follow-up. He has been challenging to take care of unfortunately.  He has been noncomplaint with meds and labs with [place].  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      Patient presents for follow-up on his cholesterol and hypertension.  He says he was in [place] and was prescribed clonidine. It is unclear what he is taking as he does not have a med list.  He says that his home BPs are normal.  He denies any side effects with the medication.   Says that he tolerated the statin well without myalgias or cramping.  He says he tries to take it daily.   The patient denies any chest pain or chest tightness.  Denies any dyspnea upon exertion.  The patient denies any blurred vision or double vision.      Has had a few ER visits and has never followed up.  He cannot recall any details about why he went.      OBJECTIVE    Blood pressure 156/92, pulse 106, temperature 98.3 ??F (36.8 ??C), temperature source Oral, resp. rate 18, height 6\' 5"  (1.956 m), weight 345 lb (156.491 kg), SpO2 98.00%.  General:  Alert, cooperative, well appearing, in no apparent distress.  HEENT: no oral lesions.    Vasc:  No swelling.  No carotid bruits.  No JVD.  CV:  The heart sounds are regular in rate and rhythm.  There is a normal S1 and S2.  There or no murmurs.  Lungs:  Inspiratory and expiratory efforts are full and unlabored.  Lung sounds are clear and equal to auscultation throughout all lung fields without wheezing, rales, or rhonchi.  Neuro:  Gait is normal.  Balance intact.  No deficits.   Psych: normal affect.  Mood good.  Oriented x 3.  Judgement and insight intact.     ASSESSMENT / PLAN    ICD-9-CM    1. HTN (hypertension) 401.9 CBC W/O DIFF     METABOLIC PANEL, COMPREHENSIVE     LIPID PANEL   2. Hypercholesterolemia 272.0 CBC W/O DIFF     METABOLIC PANEL,  COMPREHENSIVE     LIPID PANEL   3. Elevated glucose 790.29 HEMOGLOBIN A1C   4. Screening for diabetes mellitus V77.1 HEMOGLOBIN A1C   5. Cyst of left kidney 753.10 REFERRAL TO UROLOGY   6. 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 Medically noncompliant V15.81    8. Prostate cancer screening V76.44 PROSTATE SPECIFIC AG (PSA)   9. Obesity 278.00    10. Smoker 305.1          I have asked that he gets his labs done and that he brings a medication list for our review.  I have also asked for better compliance and the importance of ER follow-up.  Upon review of the records it seems like he had a stress test that was normal.  On one visit he had a kidney cyst and adrenal gland thickening on the left, suggesting follow-up.  He was referred to urology in 2011 but he did not comply.  Given his smoking history, I have referred him once again.       With respect to his HTN, it is uncontrolled.  We will continueToprol XL 100mg .  He will continue Lotrel 10/40mg  daily. I  want him to bring in other meds he takes and we can adjust accordingly.        IFG - ordered an A1c.      Obesity - Counseled on diet and exercise as tolerated.      Advised on smoking cessation.     Patient understands our medical plan. Patient has provided input and agrees with goals.  Alternatives have been explained and offered.  Risks/benefits and significant side effects of medications have been reviewed.  Patient encouraged to review package inserts for any medications.  All questions answered.  The patient is to call if condition worsens or fails to improve.     Follow-up Disposition:  Return in about 1 month (around 10/16/2012) for routine care.

## 2012-10-26 LAB — LIPID PANEL
Cholesterol, total: 217 mg/dL — ABNORMAL HIGH (ref 100–199)
HDL Cholesterol: 32 mg/dL — ABNORMAL LOW (ref >39)
LDL, calculated: 141 mg/dL — ABNORMAL HIGH (ref 0–99)
Triglyceride: 220 mg/dL — ABNORMAL HIGH (ref 0–149)
VLDL, calculated: 44 mg/dL — ABNORMAL HIGH (ref 5–40)

## 2012-10-26 LAB — CBC W/O DIFF
HCT: 40.9 % (ref 37.5–51.0)
HGB: 13.5 g/dL (ref 12.6–17.7)
MCH: 25.4 pg — ABNORMAL LOW (ref 26.6–33.0)
MCHC: 33 g/dL (ref 31.5–35.7)
MCV: 77 fL — ABNORMAL LOW (ref 79–97)
PLATELET: 277 10*3/uL (ref 150–379)
RBC: 5.31 x10E6/uL (ref 4.14–5.80)
RDW: 17.4 % — ABNORMAL HIGH (ref 12.3–15.4)
WBC: 6.4 10*3/uL (ref 3.4–10.8)

## 2012-10-26 LAB — METABOLIC PANEL, COMPREHENSIVE
A-G Ratio: 1.4 (ref 1.1–2.5)
ALT (SGPT): 10 IU/L (ref 0–44)
AST (SGOT): 12 IU/L (ref 0–40)
Albumin: 4.2 g/dL (ref 3.5–5.5)
Alk. phosphatase: 90 IU/L (ref 39–117)
BUN/Creatinine ratio: 12 (ref 9–20)
BUN: 12 mg/dL (ref 6–24)
Bilirubin, total: 0.2 mg/dL (ref 0.0–1.2)
CO2: 24 mmol/L (ref 18–29)
Calcium: 9.4 mg/dL (ref 8.7–10.2)
Chloride: 104 mmol/L (ref 97–108)
Creatinine: 1.03 mg/dL (ref 0.76–1.27)
GFR est AA: 96 mL/min/{1.73_m2} (ref >59)
GFR est non-AA: 83 mL/min/{1.73_m2} (ref >59)
GLOBULIN, TOTAL: 3 g/dL (ref 1.5–4.5)
Glucose: 88 mg/dL (ref 65–99)
Potassium: 4.5 mmol/L (ref 3.5–5.2)
Protein, total: 7.2 g/dL (ref 6.0–8.5)
Sodium: 143 mmol/L (ref 134–144)

## 2012-10-26 LAB — CVD REPORT: PDF IMAGE: 0

## 2012-10-26 LAB — HEMOGLOBIN A1C WITH EAG: Hemoglobin A1c: 6.3 % — ABNORMAL HIGH (ref 4.8–5.6)

## 2012-10-26 LAB — PSA, DIAGNOSTIC (PROSTATE SPECIFIC AG): Prostate Specific Ag: 1.5 ng/mL (ref 0.0–4.0)

## 2012-10-26 NOTE — Progress Notes (Signed)
Quick Note:    Will review at follow-up.  ______

## 2012-11-01 NOTE — Patient Instructions (Addendum)
Influenza (Flu) Vaccine: After Your Visit  Your Care Instructions  Influenza (flu) is an infection in the lungs and breathing passages. It is caused by the influenza virus. There are different strains, or types, of the flu virus every year. The flu comes on quickly. It can cause a cough, stuffy nose, fever, chills, tiredness, and aches and pains. These symptoms may last up to 10 days. The flu can make you feel very sick, but most of the time it doesn't lead to other problems. But it can cause serious problems in people who are older or who have a long-term illness, such as heart disease or diabetes.  You can help prevent the flu by getting a flu vaccine every year, as soon as it is available. It is given as a shot or in a nasal spray. The viruses in the flu shot are dead, and the nasal spray (FluMist) has weakened live viruses. You cannot get the flu from the shot or the spray. FluMist can be given to healthy people from ages 2 through 49. FluMist is not approved for pregnant women. The vaccine prevents most cases of the flu. But even when the vaccine doesn't prevent the flu, it can make symptoms less severe and reduce the chance of problems from the flu.  Sometimes, young children and people who have an immune system problem may have a slight fever or muscle aches or pains 6 to 12 hours after getting the shot. These symptoms usually last 1 or 2 days.  Follow-up care is a key part of your treatment and safety. Be sure to make and go to all appointments, and call your doctor if you are having problems. It's also a good idea to know your test results and keep a list of the medicines you take.  Who should get the flu vaccine?  Everyone age 6 months or older should get a flu vaccine each year. It lowers the chance of getting and spreading the flu. The vaccine is very important for people who are at high risk for getting other health problems from the flu. This includes:  ?? Anyone 50 years of age or older.  ?? People  who live in a long-term care center, such as a nursing home.  ?? All children 6 months through 18 years of age.  ?? Adults and children 6 months and older who have long-term heart or lung problems, such as asthma.  ?? Adults and children 6 months and older who needed medical care or were in a hospital during the past year because of diabetes, chronic kidney disease, or a weak immune system (including HIV or AIDS).  ?? Women who will be pregnant during the flu season.  ?? People who have any condition that can make it hard to breathe or swallow (such as a brain injury or muscle disorders).  ?? People who can give the flu to others who are at high risk for problems from the flu. This includes all health care workers and close contacts of people age 65 or older.  Who should not get the flu vaccine?  The person who gives the vaccine may tell you not to get it if you:  ?? Have a severe allergy to eggs or any part of the vaccine.  ?? Have had a severe reaction to a flu vaccine in the past.  ?? Have had Guillain-Barr?? syndrome (GBS).  ?? Are sick with a fever. (Get the vaccine when symptoms are gone.)  How can you care   for yourself at home?  ?? If you or your child has a sore arm or a slight fever after the shot, take an over-the-counter pain medicine, such as acetaminophen (Tylenol) or ibuprofen (Advil, Motrin). Read and follow all instructions on the label. Do not give aspirin to anyone younger than 20. It has been linked to Reye syndrome, a serious illness.  ?? Do not take two or more pain medicines at the same time unless the doctor told you to. Many pain medicines have acetaminophen, which is Tylenol. Too much acetaminophen (Tylenol) can be harmful.  When should you call for help?  Call 911 anytime you think you may need emergency care. For example, call if after getting the flu vaccine:  ?? You have symptoms of a severe reaction to the flu vaccine. Symptoms of a severe reaction may include:  ?? Severe difficulty breathing.  ??  Sudden raised, red areas (hives) all over your body.  ?? Severe lightheadedness.  Call your doctor now or seek immediate medical care if after getting the flu vaccine:  ?? You think you are having a reaction to the flu vaccine, such as a new fever.  Watch closely for changes in your health, and be sure to contact your doctor if you have any problems.   Where can you learn more?   Go to [employer]  Enter N880 in the search box to learn more about "Influenza (Flu) Vaccine: After Your Visit."   ?? 2006-2014 [employer]. Care instructions adapted under license by [employer] (which disclaims liability or warranty for this information). This care instruction is for use with your licensed healthcare professional. If you have questions about a medical condition or this instruction, always ask your healthcare professional. [employer], Inc[employer] disclaims any warranty or liability for your use of this information.  Content Version: 10.1.311062; Current as of: April 27, 2012            Appointment made for patient to see:    Doctor: Dr. Bufford Buttner    Specialty: Ophthalmology    Address/telephone #: [facility]    Appointment date/time: 11/09/12 @ 9:15

## 2012-11-01 NOTE — Progress Notes (Signed)
Chief Complaint   Patient presents with   ??? Follow-up     labs   ??? Diabetes     Per patient he does not need refills on routine meds.

## 2012-11-01 NOTE — Progress Notes (Signed)
Spoke with Patient after Personal Identifiers noted (Name/DOB) per Dr. Shelda Altes request regarding Glucose Monitoring/Use of Equipment, Hypertension Management, Medication Adherence. Nurse Navigator verbalized reminders in maintaining a Heart Healthy Diet (adhering to a low Sodium diet, adding fresh vegetables to diet, limit processed meats etc). Nurse Navigator discussed with Patient the avoidance of High cholesterol food products and a low carbohydrate/decreased sugar diet. Glucose monitoring and the use of Glucometer equipment discussed with Patient during this visit. Patient received prescription for Glucometer and Lancets through PCP today to monitor blood glucose levels at home. Patient states that he will have prescription filled some time this week. Patient will return 11/09/12 at 1:45pm for Blood Pressure Measurement. Patient to meet with Nurse Navigator after having Blood pressure obtained by Dr. Shelda Altes Nurse for further discussion and teaching in how to properly utilize Glucometer equipment for glucose measurements. Nurse Navigator instructed patient to bring in all medication containers for review and discussion. Dr. Shelda Altes Nurse scheduled Diabetic Eye appointment at [facility] 11/09/12 at 9:15am.

## 2012-11-01 NOTE — Progress Notes (Signed)
Quick Note:    Labs reviewed with patient at his appointment with Dr. Maryruth Bun.  ______

## 2012-11-01 NOTE — Progress Notes (Signed)
SUBJECTIVE  Chief Complaint   Patient presents with   ??? Follow-up     labs   ??? Diabetes     Chronically medically noncomplaint patient here for follow-up.  Has had his labs done but has not brought meds.  He is unaware of the meds he takes.  He has been challenging to take care of unfortunately due to this despite reminder calls, etc.   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      Patient presents for follow-up on his cholesterol and hypertension.  It is unclear what he is taking as he does not have a med list.  He says that his home BPs are normal.  He denies any side effects with the medication.   Says that he tolerates the statin well without myalgias or cramping.  He says he tries to take it daily.  The patient denies any chest pain or chest tightness.  Denies any dyspnea upon exertion.  The patient denies any blurred vision or double vision.      Knee injury - sees ortho for this.     Patient presents for follow-up on their new diagnosis of diabetes.  Labs are now up to date showing elevated A1c.  We have reviewed the most recent labs.  We also reviewed their cardiac risk factors. The patient is not checking home blood sugars.  The patient has did not show up to his eye doctor appointment.  Says his vision is poor.   Denies any polyuria, polydipsia, or polyphagia.       ROS:  History obtained from the patient  ?? General: negative for - chills, fever, weight changes or malaise  ?? Respiratory: no cough, shortness of breath, or wheezing  ?? Cardiovascular: no chest pain, palpitations, or dyspnea on exertion  ?? Gastrointestinal: no abdominal pain, N/V, change in bowel habits, or black or bloody stools    OBJECTIVE    Blood pressure 132/98, pulse 86, temperature 97.9 ??F (36.6 ??C), temperature source Oral, resp. rate 16, height 6\' 5"  (1.956 m), weight 342 lb (155.13 kg), SpO2 96.00%.  General:  Alert, cooperative, well appearing, in no apparent distress.  HEENT: no oral lesions.    Vasc:  No swelling.  No  carotid bruits.  No JVD.  CV:  The heart sounds are regular in rate and rhythm.  There is a normal S1 and S2.  There or no murmurs.  Lungs:  Inspiratory and expiratory efforts are full and unlabored.  Lung sounds are clear and equal to auscultation throughout all lung fields without wheezing, rales, or rhonchi.  Neuro:  Gait is normal.  Balance intact.  No deficits.   Psych: normal affect.  Mood good.  Oriented x 3.  Judgement and insight intact.     Results for orders placed in visit on 09/15/12   METABOLIC PANEL, COMPREHENSIVE       Result Value Range    Glucose 88  65 - 99 mg/dL    BUN 12  6 - 24 mg/dL    Creatinine 1.61  0.96 - 1.27 mg/dL    GFR est non-AA 83  >04 mL/min/1.73    GFR est AA 96  >59 mL/min/1.73    BUN/Creatinine ratio 12  9 - 20    Sodium 143  134 - 144 mmol/L    Potassium 4.5  3.5 - 5.2 mmol/L    Chloride 104  97 - 108 mmol/L    CO2 24  18 -  29 mmol/L    Calcium 9.4  8.7 - 10.2 mg/dL    Protein, total 7.2  6.0 - 8.5 g/dL    Albumin 4.2  3.5 - 5.5 g/dL    GLOBULIN, TOTAL 3.0  1.5 - 4.5 g/dL    A-G Ratio 1.4  1.1 - 2.5    Bilirubin, total 0.2  0.0 - 1.2 mg/dL    Alk. phosphatase 90  39 - 117 IU/L    AST 12  0 - 40 IU/L    ALT 10  0 - 44 IU/L   CBC W/O DIFF       Result Value Range    WBC 6.4  3.4 - 10.8 x10E3/uL    RBC 5.31  4.14 - 5.80 x10E6/uL    HGB 13.5  12.6 - 17.7 g/dL    HCT 16.1  09.6 - 04.5 %    MCV 77 (*) 79 - 97 fL    MCH 25.4 (*) 26.6 - 33.0 pg    MCHC 33.0  31.5 - 35.7 g/dL    RDW 40.9 (*) 81.1 - 15.4 %    PLATELET 277  150 - 379 x10E3/uL   LIPID PANEL       Result Value Range    Cholesterol, total 217 (*) 100 - 199 mg/dL    Triglyceride 914 (*) 0 - 149 mg/dL    HDL Cholesterol 32 (*) >39 mg/dL    VLDL, calculated 44 (*) 5 - 40 mg/dL    LDL, calculated 782 (*) 0 - 99 mg/dL   PROSTATE SPECIFIC AG (PSA)       Result Value Range    Prostate Specific Ag 1.5  0.0 - 4.0 ng/mL   HEMOGLOBIN A1C       Result Value Range    Hemoglobin A1c 6.3 (*) 4.8 - 5.6 %   CVD REPORT       Result Value Range     INTERPRETATION Note      PDF IMAGE .       ASSESSMENT / PLAN    ICD-9-CM    1. Diabetes 250.00 Lancets misc     glucose blood VI test strips (ASCENSIA AUTODISC VI, ONE TOUCH ULTRA TEST VI) strip     Blood-Glucose Meter monitoring kit     METABOLIC PANEL, COMPREHENSIVE     LIPID PANEL     HEMOGLOBIN A1C     MICROALBUMIN, UR, RAND   2. HTN (hypertension) 401.9    3. Hypercholesterolemia 272.0 METABOLIC PANEL, COMPREHENSIVE     LIPID PANEL   4. Obesity 278.00    5. DVT (deep venous thrombosis) 453.40    6. Knee pain 719.46    7. Need for influenza vaccination V04.81 ADMIN INFLUENZA VIRUS VAC     INFLUENZA VIRUS VACCINE, FLUVIRIN VACC, 3 YRS & >, IM, MEDICARE ONLY     At this time, I have set him up to meet with our nurse navigator prior to him leaving to help with compliance.  He has missed his eye appt.  He does not remember to bring his meds.  He will need teaching on how to use his meter.     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  He was referred to urology in 2011 but he did not  comply.  Given his smoking history, I have referred him once again.  He has an appointment 11/10/2012 with urology.     With respect to  his HTN, it is uncontrolled.  He will continue Lotrel 10/40mg  daily.  I cannot make any changes since he is not aware of his current  meds.    Diabetes  - Referred to [facility] for education.  Glucometer rx provided.  He will bring to [facility] for training.  Advised on healthy eating.  Refuses dietician  referral.  Advised on diabetic eye exam.     Hypercholesterolemia - recheck lipids after he changes his diet.     Obesity - Counseled on diet.  Declines dietician referral.     Advised on smoking cessation.     Continue follow-up with ortho for his knee and hematologist for his hypercoagulopathy.      Flu vaccine administered.     Patient understands our medical plan. Patient has provided input and agrees with goals.  Alternatives have been explained and  offered.  Risks/benefits and significant side effects of medications have been reviewed.  Patient encouraged to review package inserts for any medications.  All questions answered.  The patient is to call if condition worsens or fails to improve.     Follow-up Disposition:  Return in about 4 months (around 03/03/2013) for blood pressure. Follow-up with NN in 1 week.

## 2012-11-09 NOTE — Progress Notes (Signed)
Nurse Navigator spoke with Patient after personal Identifiers noted (Name/DOB) pertaining to coordination of care for Diabetes management. Patient stated that he was unaware if insurance would supply cost of Glucometer equipment as prescribed by PCP (Dr. Maryruth Bun). Nurse Navigator called Patient's local pharmacy to inquire whether Glucometer equipment would be supplied by Patient's current insurance coverage. Per Pharmacist at Patient's local pharmacy, Patient would be supplied Glucometer equipment at no cost. Nurse Navigator communicated Pharmacy information to Patient regarding the right to obtain free Glucometer equipment from establishment. Patient agreed to obtain Glucometer equipment today and then meet with Nurse Navigator for Glucometer testing instruction and Diabetic Management on 11/10/12 at 10:00am.

## 2012-11-09 NOTE — Progress Notes (Signed)
SUBJECTIVE  Chief Complaint   Patient presents with   ??? Hypertension      Patient presents for follow-up on their hypertension.  He has his med list for review.  Did not bring med bottles.  Did not get diabetic testing strips.   The patient has not been taking medications as prescribed - is not taking Lipitor.  The patient denies any chest pain or chest tightness.  Denies any dyspnea upon exertion.  The patient denies any headaches, blurred vision, or double vision.      OBJECTIVE    Blood pressure 130/82, pulse 62, temperature 98.6 ??F (37 ??C), temperature source Oral, resp. rate 16, height 6\' 5"  (1.956 m), weight 333 lb (151.048 kg), SpO2 99.00%.  General:  Alert, cooperative, well appearing, in no apparent distress.  CV:  The heart sounds are regular in rate and rhythm.  There is a normal S1 and S2.  There or no murmurs.  Lungs:  Inspiratory and expiratory efforts are full and unlabored.  Lung sounds are clear and equal to auscultation throughout all lung fields without wheezing, rales, or rhonchi.    ASSESSMENT / PLAN      ICD-9-CM    1. HTN (hypertension) 401.9    2. Diabetes 250.00 glucose blood VI test strips (ASCENSIA AUTODISC VI, ONE TOUCH ULTRA TEST VI) strip     Blood-Glucose Meter monitoring kit     Lancets misc   3. Hypercholesterolemia 272.0 atorvastatin (LIPITOR) 40 mg tablet        Continue current care.   Advised on better compliance with Lipitor since his cholesterol is higher.       He is meeting with the nurse navigator to review care coordination issues, specifically to review his blood sugar testing at home.       Patient understands our medical plan. Patient has provided input and agrees with goals.  Alternatives have been explained and offered.  Risks/benefits and significant side effects of medications have been reviewed.  Patient encouraged to review package inserts for any medications.  All questions answered.  The patient is to call if condition worsens or fails to improve.     Follow-up  Disposition:  Return in about 4 months (around 03/11/2013) for routine care.

## 2012-11-09 NOTE — Patient Instructions (Addendum)
Follow up in January 2015 for routine care.

## 2012-11-10 NOTE — Progress Notes (Signed)
Patient called Nurse Navigator to report that Diabetic Equipment prescription was taken to Pharmacy but prescription has not been able to be filled due to verification of Insurance coverage. Per Patient, Pharmacy has to call Medicare company to verify insurance information prior to dispensing medication. Patient cancelled meeting with Nurse Navigator today for Glucometer education due to not having own personal supplies. Patient states he will call Nurse Navigator as soon as he has his prescription filled for Diabetic supplies and then will participate in a Diabetic teaching session.

## 2012-11-14 NOTE — Progress Notes (Signed)
Spoke with Patient after personal identifiers (Name/DOB) noted pertaining to [employer] Education appointment. Patient states that he did obtain Glucometer over the weekend and agreed to meet with Nurse Navigator on 11/16/12 for Diabetic Education and Glucometer teaching.

## 2012-11-16 NOTE — Progress Notes (Signed)
Nurse Navigator meeting today pertaining to Diabetic Education (Nutrition and Glucometer equipment usage). Nurse navigator met with Patient for approximately 45 minutes discussiing the importance of adhering to a Diabetic Diet(Low Carbohydrate, Low Fat, Low glucose food products, incorporating healthy dietary food products into daily mealtime regime, etc). Patient received Diabetic educational resource material pertaining to Diabetes maintenance. Patient verbalized interest as well to meet with Dietician for further education relating to diet regime. Patient scheduled a Dietician consult for December 19, 2012 at 11:00am. Nurse Navigator provided instruction with regards to new glucometer equipment that he received from pharmacy. Patient appropriately returned demonstration of finger blood glucose testing in front of Nurse Navigator.

## 2012-12-20 NOTE — Progress Notes (Signed)
Nurse Navigator contacted Patient today to f/u regarding compliance with Adherence Plan. Patient states that he has been checking daily blood glucose levels. Patient states that fasting blood glucose measurements have been ranging from 114-120's, however, this am blood glucose measurement was 179. Patient states that he has lost weight since last PCP visit. Patient states that he has been complying with medication regime and dietary allowances. Patient reminded to bring glucose monitoring log and medication bottles to next f/u with PCP on 03/07/12.

## 2013-03-07 NOTE — Progress Notes (Signed)
Chief Complaint   Patient presents with   ??? Hypertension     has not had labs done but is fasting and will have done today   ??? Diabetes     reports he has been checking blood sugars at home; this morning it was 116     Any recent (exertional) chest pain? Patient denies  SOB? Occasionally  Palpitations? Patient denies  LE edema? Yes, has been propping legs up as needed.  He reports he is on his feet more    Annual eye exam: 2014  Pneumococcal vaccine: patient denies  Flu vaccine: 08/2012  Patient instructed to remove shoes.

## 2013-03-07 NOTE — Patient Instructions (Addendum)
DASH Diet: After Your Visit  Your Care Instructions  The DASH diet is an eating plan that can help lower your blood pressure. DASH stands for Dietary Approaches to Stop Hypertension. Hypertension is high blood pressure.  The DASH diet focuses on eating foods that are high in calcium, potassium, and magnesium. These nutrients can lower blood pressure. The foods that are highest in these nutrients are fruits, vegetables, low-fat dairy products, nuts, seeds, and legumes. But taking calcium, potassium, and magnesium supplements instead of eating foods that are high in those nutrients does not have the same effect. The DASH diet also includes whole grains, fish, and poultry.  The DASH diet is one of several lifestyle changes your doctor may recommend to lower your high blood pressure. Your doctor may also want you to decrease the amount of sodium in your diet. Lowering sodium while following the DASH diet can lower blood pressure even further than just the DASH diet alone.  Follow-up care is a key part of your treatment and safety. Be sure to make and go to all appointments, and call your doctor if you are having problems. It's also a good idea to know your test results and keep a list of the medicines you take.  How can you care for yourself at home?  Following the DASH diet  ?? Eat 4 to 5 servings of fruit each day. A serving is 1 medium-sized piece of fruit, ?? cup chopped or canned fruit, 1/4 cup dried fruit, or 4 ounces (?? cup) of fruit juice. Choose fruit more often than fruit juice.  ?? Eat 4 to 5 servings of vegetables each day. A serving is 1 cup of lettuce or raw leafy vegetables, ?? cup of chopped or cooked vegetables, or 4 ounces (?? cup) of vegetable juice. Choose vegetables more often than vegetable juice.  ?? Get 2 to 3 servings of low-fat and fat-free dairy each day. A serving is 8 ounces of milk, 1 cup of yogurt, or 1 ?? ounces of cheese.  ?? Eat 6 to 8 servings of grains each day. A serving is 1 slice of  bread, 1 ounce of dry cereal, or ?? cup of cooked rice, pasta, or cooked cereal. Try to choose whole-grain products as much as possible.  ?? Limit lean meat, poultry, and fish to 2 servings each day. A serving is 3 ounces, about the size of a deck of cards.  ?? Eat 4 to 5 servings of nuts, seeds, and legumes (cooked dried beans, lentils, and split peas) each week. A serving is 1/3 cup of nuts, 2 tablespoons of seeds, or ?? cup cooked dried beans or peas.  ?? Limit fats and oils to 2 to 3 servings each day. A serving is 1 teaspoon of vegetable oil or 2 tablespoons of salad dressing.  ?? Limit sweets and added sugars to 5 servings or less a week. A serving is 1 tablespoon jelly or jam, ?? cup sorbet, or 1 cup of lemonade.  ?? Eat less than 2,300 milligrams (mg) of sodium a day. If you have high blood pressure, diabetes, or chronic kidney disease, if you are African-American, or if you are older than age 50, try to limit the amount of sodium you eat to less than 1,500 mg a day.  Tips for success  ?? Start small. Do not try to make dramatic changes to your diet all at once. You might feel that you are missing out on your favorite foods and then be more   likely to not follow the plan. Make small changes, and stick with them. Once those changes become habit, add a few more changes.  ?? Try some of the following:  ?? Make it a goal to eat a fruit or vegetable at every meal and at snacks. This will make it easy to get the recommended amount of fruits and vegetables each day.  ?? Try yogurt topped with fruit and nuts for a snack or healthy dessert.  ?? Add lettuce, tomato, cucumber, and onion to sandwiches.  ?? Combine a ready-made pizza crust with low-fat mozzarella cheese and lots of vegetable toppings. Try using tomatoes, squash, spinach, broccoli, carrots, cauliflower, and onions.  ?? Have a variety of cut-up vegetables with a low-fat dip as an appetizer instead of chips and dip.  ?? Sprinkle sunflower seeds or chopped almonds over  salads. Or try adding chopped walnuts or almonds to cooked vegetables.  ?? Try some vegetarian meals using beans and peas. Add garbanzo or kidney beans to salads. Make burritos and tacos with mashed pinto beans or black beans.   Where can you learn more?   Go to http://www.healthwise.net/[facility]  Enter H967 in the search box to learn more about "DASH Diet: After Your Visit."   ?? 2006-2014 [employer]. Care instructions adapted under license by [employer] (which disclaims liability or warranty for this information). This care instruction is for use with your licensed healthcare professional. If you have questions about a medical condition or this instruction, always ask your healthcare professional. Healthwise, Incorporated disclaims any warranty or liability for your use of this information.  Content Version: 10.2.346038; Current as of: April 27, 2012

## 2013-03-07 NOTE — Progress Notes (Signed)
SUBJECTIVE  Chief Complaint   Patient presents with   ??? Hypertension     has not had labs done but is fasting and will have done today   ??? Diabetes     reports he has been checking blood sugars at home; this morning it was 116     Chronically medically noncomplaint patient here for follow-up.  Has not had his labs done.  Although instructed in the past, does not bring meds for review consistently.  He has been challenging to take care of unfortunately due to some of these issues.       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  Says that he has had ongoing chronic pain and in the past in [place] was seeing pain management.  Has noted increased pain as he stands prolonged teaching.  Says that he would like to go back to see a pain management for better care of daytime pain.  He is on Percocet and was on an extended release morphine prescribed by his oncologist but has since stopped the morphine.  Takes the percocet at night.       Patient presents for follow-up on his cholesterol and hypertension.  Says his clonidine was stopped by his oncologist.  He says that his home BPs are elevated.  He denies any side effects with the medication.   Says that he tolerates the statin well without myalgias or cramping.  He says he tries to take it daily.  The patient denies any chest pain or chest tightness.  Denies any dyspnea upon exertion.  The patient denies any blurred vision or double vision.      Patient presents for follow-up on their diabetes as well.  We also reviewed their cardiac risk factors. The patient is checking home blood sugars and reports consistent fasting levels between 100-120.  The patient has did see the eye doctor in Sept 2014.  Denies any polyuria, polydipsia, or polyphagia.       ROS:  History obtained from the patient  ?? General: negative for - chills, fever, weight changes or malaise  ?? Respiratory: no cough, shortness of breath, or wheezing  ?? Cardiovascular: no chest pain, palpitations, or dyspnea  on exertion  ?? Gastrointestinal: no abdominal pain, N/V, change in bowel habits, or black or bloody stools    OBJECTIVE    Blood pressure 150/88, pulse 78, temperature 97.8 ??F (36.6 ??C), temperature source Oral, resp. rate 16, height 6\' 5"  (1.956 m), weight 339 lb (153.769 kg), SpO2 97.00%.  General:  Alert, cooperative, well appearing, in no apparent distress.  HEENT: no oral lesions.    Vasc:  No swelling.  No carotid bruits.  No JVD.  CV:  The heart sounds are regular in rate and rhythm.  There is a normal S1 and S2.  There or no murmurs.  Lungs:  Inspiratory and expiratory efforts are full and unlabored.  Lung sounds are clear and equal to auscultation throughout all lung fields without wheezing, rales, or rhonchi.  Neuro:  Gait is normal.  Balance intact.  No deficits.   Psych: normal affect.  Mood good.  Oriented x 3.  Judgement and insight intact.     ASSESSMENT / PLAN    ICD-9-CM    1. Diabetes 250.00    2. HTN (hypertension) 401.9 atenolol (TENORMIN) 50 mg tablet   3. Hypercholesterolemia 272.0    4. Obesity 278.00    5. DVT (deep venous thrombosis) 453.40 REFERRAL TO PAIN MANAGEMENT  6. Chronic leg pain 729.5 REFERRAL TO PAIN MANAGEMENT     Diabetes - overdue for labs.  Diabetic dieting.  Have offered to see diabetic educator and dietician.  Check feet daily, eyes annually.  Check home blood sugars.     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  He was referred to urology in 2011 but he did not  comply.  Given his smoking history, I had referred him to urology.  He had an appointment 11/10/2012 with urology but was a no-show.  He declines  referral at this time.     With respect to his HTN, it is uncontrolled.  He will continue Lotrel 10/40mg  daily.  I will add atenolol 50mg  daily.   DASH dieting.     Hypercholesterolemia - recheck lipids overdue.    Obesity - Counseled on diet.  Declines dietician referral.     Advised on smoking cessation.     Advised  he is overdue to see GI per their notes.     Continue follow-up with ortho for his knee and hematologist for his hypercoagulopathy.  Refer to pain management for chronic pain.     He has a tremendous history of medical noncompliance.  We are trying our best to encourage compliance but he has not done well.     Patient understands our medical plan. Patient has provided input and agrees with goals.  Alternatives have been explained and offered.  Risks/benefits and significant side effects of medications have been reviewed.  Patient encouraged to review package inserts for any medications.  All questions answered.  The patient is to call if condition worsens or fails to improve.     Follow-up Disposition:  Return 1-2 weeks for blood pressure.

## 2013-03-14 ENCOUNTER — Encounter

## 2013-03-17 LAB — METABOLIC PANEL, COMPREHENSIVE
A-G Ratio: 1.6 (ref 1.1–2.5)
ALT (SGPT): 10 IU/L (ref 0–44)
AST (SGOT): 11 IU/L (ref 0–40)
Albumin: 4.6 g/dL (ref 3.5–5.5)
Alk. phosphatase: 108 IU/L (ref 39–117)
BUN/Creatinine ratio: 13 (ref 9–20)
BUN: 13 mg/dL (ref 6–24)
Bilirubin, total: 0.4 mg/dL (ref 0.0–1.2)
CO2: 22 mmol/L (ref 18–29)
Calcium: 9.8 mg/dL (ref 8.7–10.2)
Chloride: 101 mmol/L (ref 97–108)
Creatinine: 1 mg/dL (ref 0.76–1.27)
GFR est AA: 100 mL/min/{1.73_m2} (ref >59)
GFR est non-AA: 86 mL/min/{1.73_m2} (ref >59)
GLOBULIN, TOTAL: 2.8 g/dL (ref 1.5–4.5)
Glucose: 115 mg/dL — ABNORMAL HIGH (ref 65–99)
Potassium: 4.4 mmol/L (ref 3.5–5.2)
Protein, total: 7.4 g/dL (ref 6.0–8.5)
Sodium: 141 mmol/L (ref 134–144)

## 2013-03-17 LAB — MICROALBUMIN, UR, RAND W/ MICROALB/CREAT RATIO: Microalbumin, urine: 772.3 ug/mL — ABNORMAL HIGH (ref 0.0–17.0)

## 2013-03-17 LAB — LIPID PANEL
Cholesterol, total: 150 mg/dL (ref 100–199)
HDL Cholesterol: 37 mg/dL — ABNORMAL LOW (ref >39)
LDL, calculated: 82 mg/dL (ref 0–99)
Triglyceride: 157 mg/dL — ABNORMAL HIGH (ref 0–149)
VLDL, calculated: 31 mg/dL (ref 5–40)

## 2013-03-17 LAB — HEMOGLOBIN A1C WITH EAG: Hemoglobin A1c: 6.2 % — ABNORMAL HIGH (ref 4.8–5.6)

## 2013-03-17 LAB — CVD REPORT: PDF IMAGE: 0

## 2013-03-23 MED ORDER — AMLODIPINE-BENAZEPRIL 10 MG-40 MG CAP
10-40 mg | ORAL_CAPSULE | Freq: Every day | ORAL | Status: DC
Start: 2013-03-23 — End: 2014-04-04

## 2013-03-23 MED ORDER — ATORVASTATIN 40 MG TAB
40 mg | ORAL_TABLET | Freq: Every day | ORAL | Status: DC
Start: 2013-03-23 — End: 2014-04-04

## 2013-03-23 MED ORDER — ATENOLOL 100 MG TAB
100 mg | ORAL_TABLET | Freq: Every day | ORAL | Status: DC
Start: 2013-03-23 — End: 2013-05-03

## 2013-03-23 NOTE — Progress Notes (Signed)
SUBJECTIVE  Chief Complaint   Patient presents with   ??? Hypertension   ??? Cholesterol Problem   ??? Diabetes     preDM     Chronically medically noncomplaint patient here for follow-up of uncontrolled HTN.  He presents after his appointment time but we have accommodated him.  Has had his labs done.  Although instructed in the past, does not bring meds for review consistently.  He has been challenging to take care of unfortunately due to some of these issues.       Patient presents for follow-up on his cholesterol and hypertension.  Says his clonidine was stopped by his oncologist.  He says that his home BPs are elevated.  He was started on atenolol 50mg  daily which he says he is taking for the past 2 weeks with his Lotrel.  He denies any side effects with the medication.   Says that he tolerates the statin well without myalgias or cramping.  The patient denies any chest pain or chest tightness.  Denies any dyspnea upon exertion.  The patient denies any blurred vision or double vision.      Patient presents for follow-up on their diabetes as well.  We also reviewed their cardiac risk factors. The patient is intermittently checking home blood sugars and reports consistent fasting levels between 100-120.  The patient has seen the eye doctor in Sept 2014.  Denies any polyuria, polydipsia, or polyphagia.       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  No new complaints.      ROS:  History obtained from the patient  ?? General: negative for - chills, fever, weight changes or malaise  ?? Respiratory: no cough, shortness of breath, or wheezing  ?? Cardiovascular: no chest pain, palpitations, or dyspnea on exertion  ?? Gastrointestinal: no abdominal pain, N/V, change in bowel habits, or black or bloody stools    OBJECTIVE    Blood pressure 156/86, pulse 79, temperature 98.4 ??F (36.9 ??C), temperature source Oral, resp. rate 16, height 6\' 5"  (1.956 m), weight 339 lb (153.769 kg), SpO2 95.00%.  General:  Alert, cooperative, well  appearing, in no apparent distress.  HEENT: no oral lesions.    Vasc:  No swelling.  No carotid bruits.  No JVD.  CV:  The heart sounds are regular in rate and rhythm.  There is a normal S1 and S2.  There or no murmurs.  Lungs:  Inspiratory and expiratory efforts are full and unlabored.  Lung sounds are clear and equal to auscultation throughout all lung fields without wheezing, rales, or rhonchi.  Psych: normal affect.  Mood good.  Oriented x 3.  Judgement and insight intact.     ASSESSMENT / PLAN    ICD-9-CM    1. HTN (hypertension) 401.9 atenolol (TENORMIN) 100 mg tablet     amLODIPine-benazepril (LOTREL) 10-40 mg per capsule   2. Hypercholesterolemia 272.0 atorvastatin (LIPITOR) 40 mg tablet   3. Prediabetes 790.29    4. DVT (deep venous thrombosis) 453.40    5. Obesity 278.00    6. Smoker 305.1      Prediabetes -  A1c is stable.  Diabetic dieting.  Have offered to see diabetic educator and dietician.  Check feet daily, eyes annually.  Check home blood sugars.     HTN - uncontrolled.  Increase to atenolol 100mg  daily.  Continue Lotrel.      Hypercholesterolemia - improved lipid values.  Continue current care.    He has a history  of kidney cyst and adrenal gland thickening on the left, suggesting follow-up.  He was referred to urology in 2011 but he did not  comply.  Given his smoking history, I had referred him to urology.  He had an appointment 11/10/2012 with urology but was a no-show.  He declines  referral at this time.     Obesity - Counseled on diet.  Discussed the patient's above normal BMI with him.  I have recommended the following interventions: dietary  management education, guidance, and counseling .  The BMI follow up plan is as follows: BMI is out of normal parameters and plan is as follows:  Referred to Dietitian    Advised on smoking cessation.     Advised he is overdue to see GI per their notes.     Continue follow-up with hematologist for his hypercoagulopathy.     He has a tremendous history of  medical noncompliance.  Today's visit was better in that he had better overall compliance with past treatment recommendations.  We are trying our best to encourage compliance.     Patient understands our medical plan. Patient has provided input and agrees with goals.  Alternatives have been explained and offered.  Risks/benefits and significant side effects of medications have been reviewed.  Patient encouraged to review package inserts for any medications. All questions answered.  The patient is to call if condition worsens or fails to improve.     Follow-up Disposition:  Return in about 1 month (around 04/20/2013) for blood pressure.  Please bring all medication bottles to appointment.

## 2013-03-23 NOTE — Progress Notes (Signed)
Chief Complaint   Patient presents with   ??? Hypertension   ??? Cholesterol Problem   ??? Diabetes     preDM     Any recent (exertional) chest pain? Patient denies  SOB? Patient denies  Palpitations? Patient denies  LE edema? Patient denies  Dizziness/lghtheadedness? Patient denies    1. Have you been to the ER, urgent care clinic since your last visit?  Hospitalized since your last visit? Yes, [facility]3 weeks ago     2. Have you seen or consulted any other health care providers outside of the [facility] since your last visit?  Include any pap smears or colon screening. No    Upcoming appointment with Dr. Arnette FeltsFranzman on 04/15/13  [facility] 03/07/13

## 2013-04-28 NOTE — Telephone Encounter (Signed)
Nurse Navigator spoke with patient after personal identifiers noted (Name/DOB). Nurse Navigator communicated PCP appointment reminder for 05/03/13 to patient. Nurse Navigator advised patient to bring all medication bottles to f/u PCP appointment. Patient requested resource information for free or low cost dental care services. Nurse Navigator provided patient the following contact information for free or low cost dental care:  [facility], [address], [place], [place], [phone]  [facility], [address], [place], [place], [id number], [phone]

## 2013-05-03 MED ORDER — ATENOLOL 100 MG TAB
100 mg | ORAL_TABLET | Freq: Every day | ORAL | Status: DC
Start: 2013-05-03 — End: 2014-04-04

## 2013-05-03 NOTE — Progress Notes (Signed)
SUBJECTIVE  Chief Complaint   Patient presents with   ??? Hypertension   ??? Cholesterol Problem     Chronically medically noncomplaint patient here for follow-up of uncontrolled HTN.  He has been challenging to take care of but has brought his med list and showed up on time for his appt this time.  He says he feels better being on the correct antihypertensive meds.       Patient presents for follow-up on his cholesterol and hypertension.  He was started on an increased dose of atenolol 135m daily at the last visit along with his Lotrel.  He denies any side effects with the medication.   Says that he tolerates the statin well without myalgias or cramping.  The patient denies any chest pain or chest tightness.  Denies any dyspnea upon exertion.  The patient denies any blurred vision or double vision.      Patient presents for follow-up on their diabetes as well.  We also reviewed their cardiac risk factors. The patient is intermittently checking home blood sugars and reports consistent fasting levels between 100-120.  The patient has seen the eye doctor in Sept 2014.  Denies any polyuria, polydipsia, or polyphagia.       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  No new complaints but has chronic pain.  We had referred him to pain management but he says he has not had a call from them as of yet.  Currently Dr. FSalvadore Farbermanages his meds.     ROS:  History obtained from the patient  ?? General: negative for - chills, fever, weight changes or malaise  ?? Respiratory: no cough, shortness of breath, or wheezing  ?? Cardiovascular: no chest pain, palpitations, or dyspnea on exertion  ?? Gastrointestinal: no abdominal pain, N/V, change in bowel habits, or black or bloody stools    OBJECTIVE    Blood pressure 130/86, pulse 84, temperature 98.3 ??F (36.8 ??C), temperature source Oral, resp. rate 16, height 6' 5" (1.956 m), weight 341 lb (154.677 kg), SpO2 96 %.  General:  Alert, cooperative, well appearing, in no apparent distress.   HEENT: no oral lesions.    Vasc:  No swelling.  No carotid bruits.  No JVD.  CV:  The heart sounds are regular in rate and rhythm.  There is a normal S1 and S2.  There or no murmurs.  Lungs:  Inspiratory and expiratory efforts are full and unlabored.  Lung sounds are clear and equal to auscultation throughout all lung fields without wheezing, rales, or rhonchi.  Psych: normal affect.  Mood good.  Oriented x 3.  Judgement and insight intact.     Results for orders placed in visit on 096/28/36  METABOLIC PANEL, COMPREHENSIVE       Result Value Ref Range    Glucose 115 (*) 65 - 99 mg/dL    BUN 13  6 - 24 mg/dL    Creatinine 1.00  0.76 - 1.27 mg/dL    GFR est non-AA 86  >59 mL/min/1.73    GFR est AA 100  >59 mL/min/1.73    BUN/Creatinine ratio 13  9 - 20    Sodium 141  134 - 144 mmol/L    Potassium 4.4  3.5 - 5.2 mmol/L    Chloride 101  97 - 108 mmol/L    CO2 22  18 - 29 mmol/L    Calcium 9.8  8.7 - 10.2 mg/dL    Protein, total 7.4  6.0 -  8.5 g/dL    Albumin 4.6  3.5 - 5.5 g/dL    GLOBULIN, TOTAL 2.8  1.5 - 4.5 g/dL    A-G Ratio 1.6  1.1 - 2.5    Bilirubin, total 0.4  0.0 - 1.2 mg/dL    Alk. phosphatase 108  39 - 117 IU/L    AST 11  0 - 40 IU/L    ALT 10  0 - 44 IU/L   LIPID PANEL       Result Value Ref Range    Cholesterol, total 150  100 - 199 mg/dL    Triglyceride 157 (*) 0 - 149 mg/dL    HDL Cholesterol 37 (*) >39 mg/dL    VLDL, calculated 31  5 - 40 mg/dL    LDL, calculated 82  0 - 99 mg/dL   HEMOGLOBIN A1C       Result Value Ref Range    Hemoglobin A1c 6.2 (*) 4.8 - 5.6 %   MICROALBUMIN, UR, RAND       Result Value Ref Range    Microalbumin, urine 772.3 (*) 0.0 - 17.0 ug/mL   CVD REPORT       Result Value Ref Range    INTERPRETATION Note      PDF IMAGE .       ASSESSMENT / PLAN    ICD-9-CM    1. Prediabetes 790.29 HEMOGLOBIN A1C     LIPID PANEL     HEPATIC FUNCTION PANEL   2. HTN (hypertension) 401.9 atenolol (TENORMIN) 100 mg tablet   3. Hypercholesterolemia 272.0 LIPID PANEL     HEPATIC FUNCTION PANEL   4.  Microalbuminuria 791.0    5. DVT (deep venous thrombosis) 453.40    6. Obesity 278.00    7. Smoker 305.1    8. Chronic pain 338.29    9. Need for pneumococcal vaccination V03.82 ADMIN PNEUMOCOCCAL VACCINE     PNEUMOCOCCAL POLYSACCHARIDE VACCINE, 23-VALENT, ADULT OR IMMUNOSUPPRESSED PT DOSE,     Prediabetes -  A1c is stable.  Diabetic dieting.  Have offered to see diabetic educator and dietician once again.  He has been in touch with [facility].  Check feet daily, eyes annually.  Check home blood sugars.     HTN - better control due to better complaince.  Continue Lotrel and atenolol at same doses.      Microalbuminuria - continue Lotrel.      Hypercholesterolemia - Continue current care.  Diet advised low in cholesterol.      DVT - continue follow-up with oncology.     Chronic pain - nurse will assist with referral process once again.     Obesity - Counseled on diet.  Discussed the patient's above normal BMI with him.  I have recommended the following interventions: dietary  management education, guidance, and counseling .  The BMI follow up plan is as follows: BMI is out of normal parameters and plan is as follows:  Referred to Dietitian    Smoker - Advised on smoking cessation.  Pneumonia vaccine administered.      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  He was referred to urology in 2011 but he did not  comply.  Given his smoking history, I had referred him to urology.  He had an appointment 11/10/2012 with urology but was a no-show.  He declines  referral at this time.     Advised he is overdue to see  GI per their notes.  Sent him next door with a copy of his OV note stating they wanted one year follow-up.    He has a tremendous history of medical noncompliance but has done better this visit.  I congratulated him.      Patient understands our medical plan. Patient has provided input and agrees with goals.  Alternatives have been explained and offered.   Risks/benefits and significant side effects of medications have been reviewed.  Patient encouraged to review package inserts for any medications. All questions answered.  The patient is to call if condition worsens or fails to improve.     Follow-up Disposition:  Return in about 4 months (around 09/02/2013) for routine care and medicare wellness. Fasting labs due 3-7 days prior to visit.

## 2013-05-03 NOTE — Progress Notes (Signed)
Chief Complaint   Patient presents with   ??? Hypertension   ??? Cholesterol Problem     Any recent (exertional) chest pain? Patient denies  SOB? Patient denies  Palpitations? Patient denies  LE edema? Patient denies  Dizziness/lightheadedness? Patient denies    1. Have you been to the ER, urgent care clinic since your last visit?  Hospitalized since your last visit? [facility]    2. Have you seen or consulted any other health care providers outside of the [facility] since your last visit?  Include any pap smears or colon screening. No    Patient completed adult immunization consent form.  Allergies, contraindications and recommendations reviewed with patient. Pneumovax 23 vaccine administered IM right deltoid.  Patient tolerated well.

## 2013-06-23 NOTE — Progress Notes (Signed)
Nurse Navigator episode closed>30days (11/09/13 Adherence Plan).

## 2013-08-30 NOTE — Telephone Encounter (Signed)
LMOV [phone] [phone] for patient to call [facility].  Patient was supposed to have labs done prior to 08/30/13 appt.

## 2013-08-31 NOTE — Telephone Encounter (Signed)
Nurse Navigator spoke with patient today after personal identifiers noted (Name/DOB) regarding missed f/u PCP appointment on 08/30/13. Patient communicated to Nurse Navigator that he was not able to attend f/u PCP appointment due to a lack of transportation to [facility]. Nurse Navigator provided patient with contact information for the [employer] bus [phone]) as a resource for future transportation needs to [facility] medical appointments. Patient declined to reschedule routine f/u PCP appointment at time of Nurse Navigator outreach call. Patient communicated to Nurse Navigator that he will call [facility] back to reschedule f/u PCP appointment at a later time.

## 2013-10-17 NOTE — Telephone Encounter (Signed)
Nurse Navigator attempted to contact patient regarding future f/u PCP appointment scheduling (Routine Care). Nurse Navigator called patient's home and mobile numbers as listed per Epic record and left voice mail recordings with contact information and to call [facility] back at earliest convenience to schedule future f/u PCP appointment.

## 2013-11-08 ENCOUNTER — Encounter

## 2013-11-14 NOTE — Procedures (Signed)
[facility]  *** FINAL REPORT ***    Name: Sean CootsBOWDEN, Sean  MRN: [id number]    Outpatient  DOB: 18 Apr 1960  HIS Order #: [id number]  TRAKnet Visit #: [id number]  Date: 13 Nov 2013    TYPE OF TEST: Peripheral Venous Testing    REASON FOR TEST  Limb swelling    Left Leg:-  Deep venous thrombosis:           Yes  Proximal extent of thrombus:      Common Femoral  Superficial venous thrombosis:    Not examined  Deep venous insufficiency:        Not examined  Superficial venous insufficiency: Not examined      INTERPRETATION/FINDINGS  Left leg :  1. Chronic partial recannalized deep vein thrombosis identified in  common femoral,popliteal,gastrocnemius, and posterior tibial vein.    2. Chronic occlusive deep vein thrombosis identified from proximal  femoral vein segment to distal femoral vein segment.    3. Deep venous thrombosis identified in the common femoral, proximal  femoral, mid femoral, distal femoral, popliteal(above knee),  popliteal(fossa), popliteal(below knee), posterior tibial and  gastrocnemius veins.    4. No evidence of deep vein thrombosis in the contralateral common  femoral vein.    ADDITIONAL COMMENTS  No significant change when compare to previous exam performed on  10/28/10.Patient is currently on blood thinner.The thrombus has  progressed from femoral to common femoral vein segment when compare to   previous exam.    I have personally reviewed the data relevant to the interpretation of  this  study.    TECHNOLOGIST: Bess KindsEmmanuel Bathelemy, [facility], RVT/  Signed: 11/13/2013 03:31 PM    PHYSICIAN: Vernia BuffMarc A. Lorine Bearsamacho, MD  Signed: 11/14/2013 04:44 PM

## 2013-11-14 NOTE — Procedures (Signed)
[facility]  *** FINAL REPORT ***    Name: Sean [name], Sean [name]  MRN: [id number]    Outpatient  DOB: 18 Apr 1960  HIS Order #: [id number]  TRAKnet Visit #: [id number]  Date: 13 Nov 2013    TYPE OF TEST: Peripheral Venous Testing    REASON FOR TEST  Limb swelling    Left Leg:-  Deep venous thrombosis:           Yes  Proximal extent of thrombus:      Common Femoral  Superficial venous thrombosis:    Not examined  Deep venous insufficiency:        Not examined  Superficial venous insufficiency: Not examined      INTERPRETATION/FINDINGS  Left leg :  1. Chronic partial recannalized deep vein thrombosis identified in  common femoral,popliteal,gastrocnemius, and posterior tibial vein.    2. Chronic occlusive deep vein thrombosis identified from proximal  femoral vein segment to distal femoral vein segment.    3. Deep venous thrombosis identified in the common femoral, proximal  femoral, mid femoral, distal femoral, popliteal(above knee),  popliteal(fossa), popliteal(below knee), posterior tibial and  gastrocnemius veins.    4. No evidence of deep vein thrombosis in the contralateral common  femoral vein.    ADDITIONAL COMMENTS  No significant change when compare to previous exam performed on  10/28/10.Patient is currently on blood thinner.The thrombus has  progressed from femoral to common femoral vein segment when compare to   previous exam.    I have personally reviewed the data relevant to the interpretation of  this  study.    TECHNOLOGIST: Emmanuel Bathelemy, BHSC, RVT/  Signed: 11/13/2013 03:31 PM    PHYSICIAN: Delando Satter A. Layliana Devins, MD  Signed: 11/14/2013 04:44 PM

## 2013-11-15 NOTE — Telephone Encounter (Signed)
Cindy from [facility] called to notify [name] that the patient is not going to appointments at [facility] because he does not have a ride. No action required.

## 2013-11-16 ENCOUNTER — Encounter: Attending: Pain Medicine

## 2014-02-27 NOTE — Telephone Encounter (Signed)
Spoke with patient after personal identifiers noted (Name/DOB) regarding future f/u PCP appointment scheduling for routine care. Patient requested to schedule a f/u PCP appointment on 03/15/14 at 1:30pm at time of outreach call. Patient verbalized awareness that his LOV with PCP was in March 2015. Patient stated "I know I have not seen the doctor in a long time. I will definitely try to make this appointment". Patient conveyed that he has experienced a lack of transportation to attend previous medical appointments. Patient stated that he mainly depends on his relatives for commutes to and from medical appointments. Patient provided with the following transportation resources for future reference:  [place] [facility]-[phone]-direct number for driver  [employer] [facility] [place] [facility]-[phone]-Ride-[phone]     Patient advised to call [facility] at earliest convenience if future f/u PCP appointment requires rescheduling. Patient advised of the importance to attend future f/u PCP appointment as scheduled/requested. Patient's LOV with PCP 05/03/13 per Epic record.

## 2014-03-14 NOTE — Telephone Encounter (Signed)
Spoke with patient after personal identifiers noted (Name/DOB) regarding f/u PCP appointment for 03/15/14. Patient requested to cancel f/u PCP appointment on 03/15/14 citing transportation issues. Patient reported that he relies on family members for transportation needs. Patient rescheduled f/u PCP appointment for 04/04/14 at 2:45pm at time of outreach call. Patient advised again to seek the transportation resources that was previously provided to him for future commutes to medical office appointments.   Higher education careers adviser( Express Bus-[phone]-direct number for driver  [facility] [place] [facility]-[phone]-Ride-[phone]   Patient verbalized understanding.

## 2014-03-15 ENCOUNTER — Encounter: Attending: Family Medicine

## 2014-04-03 NOTE — Telephone Encounter (Signed)
Spoke with patient after personal identifiers noted (Name/DOB) regarding f/u PCP appointment reminder for 04/04/14 at 2:45pm. Patient verbalized acceptance of future PCP appointment time/date. Patient conveyed that he will be provided with transportation to [facility] by a family member on 04/04/14. Patient advised of the importance of attending f/u PCP appointment on 04/04/14. Patient's LOV with PCP 05/03/13 per Epic record. Patient advised to call [facility] at earliest convenience if future f/u PCP appointment requires rescheduling. Patient verbalized understanding.

## 2014-04-04 ENCOUNTER — Ambulatory Visit: Admit: 2014-04-04 | Discharge: 2014-04-04 | Payer: MEDICARE | Attending: Family Medicine

## 2014-04-04 ENCOUNTER — Encounter

## 2014-04-04 DIAGNOSIS — R7303 Prediabetes: Secondary | ICD-10-CM

## 2014-04-04 DIAGNOSIS — Z0000 Encounter for general adult medical examination without abnormal findings: Secondary | ICD-10-CM

## 2014-04-04 LAB — AMB POC HEMOGLOBIN A1C: Hemoglobin A1c (POC): 6.1 %

## 2014-04-04 MED ORDER — ATENOLOL 100 MG TAB
100 mg | ORAL_TABLET | Freq: Every day | ORAL | Status: DC
Start: 2014-04-04 — End: 2014-12-17

## 2014-04-04 MED ORDER — AMLODIPINE-BENAZEPRIL 10 MG-40 MG CAP
10-40 mg | ORAL_CAPSULE | Freq: Every day | ORAL | Status: DC
Start: 2014-04-04 — End: 2014-12-17

## 2014-04-04 MED ORDER — ATORVASTATIN 40 MG TAB
40 mg | ORAL_TABLET | Freq: Every day | ORAL | Status: DC
Start: 2014-04-04 — End: 2014-12-17

## 2014-04-04 NOTE — Patient Instructions (Addendum)
A Healthy Lifestyle: Care Instructions  Your Care Instructions  A healthy lifestyle can help you feel good, stay at a healthy weight, and have plenty of energy for both work and play. A healthy lifestyle is something you can share with your whole family.  A healthy lifestyle also can lower your risk for serious health problems, such as high blood pressure, heart disease, and diabetes.  You can follow a few steps listed below to improve your health and the health of your family.  Follow-up care is a key part of your treatment and safety. Be sure to make and go to all appointments, and call your doctor if you are having problems. It???s also a good idea to know your test results and keep a list of the medicines you take.  How can you care for yourself at home?  ?? Do not eat too much sugar, fat, or fast foods. You can still have dessert and treats now and then. The goal is moderation.  ?? Start small to improve your eating habits. Pay attention to portion sizes, drink less juice and soda pop, and eat more fruits and vegetables.  ?? Eat a healthy amount of food. A 3-ounce serving of meat, for example, is about the size of a deck of cards. Fill the rest of your plate with vegetables and whole grains.  ?? Limit the amount of soda and sports drinks you have every day. Drink more water when you are thirsty.  ?? Eat at least 5 servings of fruits and vegetables every day. It may seem like a lot, but it is not hard to reach this goal. A serving or helping is 1 piece of fruit, 1 cup of vegetables, or 2 cups of leafy, raw vegetables. Have an apple or some carrot sticks as an afternoon snack instead of a candy bar. Try to have fruits and/or vegetables at every meal.  ?? Make exercise part of your daily routine. You may want to start with simple activities, such as walking, bicycling, or slow swimming. Try to be active 30 to 60 minutes every day. You do not need to do all 30 to 60  minutes all at once. For example, you can exercise 3 times a day for 10 or 20 minutes. Moderate exercise is safe for most people, but it is always a good idea to talk to your doctor before starting an exercise program.  ?? Keep moving. Mow the lawn, work in the garden, or clean your house. Take the stairs instead of the elevator at work.  ?? If you smoke, quit. People who smoke have an increased risk for heart attack, stroke, cancer, and other lung illnesses. Quitting is hard, but there are ways to boost your chance of quitting tobacco for good.  ?? Use nicotine gum, patches, or lozenges.  ?? Ask your doctor about stop-smoking programs and medicines.  ?? Keep trying.  In addition to reducing your risk of diseases in the future, you will notice some benefits soon after you stop using tobacco. If you have shortness of breath or asthma symptoms, they will likely get better within a few weeks after you quit.  ?? Limit how much alcohol you drink. Moderate amounts of alcohol (up to 2 drinks a day for men, 1 drink a day for women) are okay. But drinking too much can lead to liver problems, high blood pressure, and other health problems.  Family health  If you have a family, there are many things you can do   together to improve your health.  ?? Eat meals together as a family as often as possible.  ?? Eat healthy foods. This includes fruits, vegetables, lean meats and dairy, and whole grains.  ?? Include your family in your fitness plan. Most people think of activities such as jogging or tennis as the way to fitness, but there are many ways you and your family can be more active. Anything that makes you breathe hard and gets your heart pumping is exercise. Here are some tips:  ?? Walk to do errands or to take your child to school or the bus.  ?? Go for a family bike ride after dinner instead of watching TV.   Where can you learn more?   Go to [email or web address].huhttp://www.healthwise.net/[facility]   Enter 825-087-9676U807 in the search box to learn more about "A Healthy Lifestyle: Care Instructions."   ?? 2006-2015 Healthwise, Incorporated. Care instructions adapted under license by [employer] (which disclaims liability or warranty for this information). This care instruction is for use with your licensed healthcare professional. If you have questions about a medical condition or this instruction, always ask your healthcare professional. Healthwise, Incorporated disclaims any warranty or liability for your use of this information.  Content Version: 10.7.482551; Current as of: December 30, 2012            [facility]   [address]  [address]  [place], [place] [id number]  [phone]

## 2014-04-04 NOTE — Addendum Note (Signed)
Addended by: Edwena BundeMOORE, Sorcha Rotunno P on: 04/04/2014 04:55 PM      Modules accepted: Level of Service

## 2014-04-04 NOTE — Progress Notes (Signed)
1. Have you been to the ER, urgent care clinic since your last visit?  Hospitalized since your last visit?Yes When: January 31,2016 [facility] Reason for visit: right lower leg pain    2. Have you seen or consulted any other health care providers outside of the [facility] since your last visit?  Include any pap smears or colon screening. Yes When: January 04,2016 and on January 12,2016 [facility]  Reason for visit: right lower leg wound and lower extremity/arterial study. See wound care weekly and on February 03,2016 Hematology/Oncology Dr. Arnette FeltsFranzman for history of mutiple DVT's     Abuse Screening Questionnaire 04/04/2014   Do you ever feel afraid of your partner? N   Are you in a relationship with someone who physically or mentally threatens you? N   Is it safe for you to go home? Y     Fall Risk Assessment, last 12 mths 04/04/2014   Able to walk? Yes   Fall in past 12 months? No               PHQ 2 / 9, over the last two weeks 04/04/2014   Little interest or pleasure in doing things Several days   Feeling down, depressed or hopeless Not at all   Total Score PHQ 2 1

## 2014-04-04 NOTE — Patient Instructions (Addendum)
Medicare Part B Preventive Services Limitations Recommendation Scheduled         Cardiovascular Screening Blood Tests (every 5 years)  Total cholesterol, HDL, Triglycerides Order as a panel if possible Due for cholesterol testing in 6 weeks    Colorectal Cancer Screening  -Fecal occult blood test (annual)  -Flexible sigmoidoscopy (5y)  -Screening colonoscopy (10y)  -Barium Enema  Due for colonoscopy.  Please contact your GI doctor.          Diabetes Screening Tests (at least every 3 years, Medicare covers annually or at 4542-month intervals for prediabetic patients)    Fasting blood sugar (FBS) or glucose tolerance test (GTT) Patient must be diagnosed with one of the following:  -Hypertension, Dyslipidemia, obesity, previous impaired FBS or GTT  ???Or any two of the following: overweight, FH of diabetes, age ?65, history of gestational diabetes, birth of baby weighing more than 9 pounds Done in office today - levels look to be in the prediabetic range.          Glaucoma Screening (no USPSTF recommendation) Diabetes mellitus, family history, African American, age 950 or over, Hispanic American, age 54 or over Recommend seeing the eye doctor for a check-up                Prostate Cancer Screening (annually up to age 54)  - Digital rectal exam (DRE)  - Prostate specific antigen (PSA) Annually (age 54 or over), DRE not paid separately when covered E/M service is provided on same date  Men up to age 49 may need a screening blood test for prostate cancer at certain intervals, depending on their personal and family history. This decision is between the patient and his provider. Declined prostate exam.  We have ordered bloodwork to look for prostate cancer.    Seasonal Influenza Vaccination (annually)  Recommend yearly flu vaccine at local pharmacy (since we are currently out)      Advance Directives: Care Instructions  Your Care Instructions  An advance directive is a legal way to state your wishes at the end of  your life. It tells your family and your doctor what to do if you can no longer say what you want.  There are two main types of advance directives. You can change them any time that your wishes change.  ?? A living will tells your family and your doctor your wishes about life support and other treatment.  ?? A medical power of attorney lets you name a person to make treatment decisions for you when you can't speak for yourself. This person is called a health care agent.  If you do not have an advance directive, decisions about your medical care may be made by a doctor or a judge who doesn't know you.  It may help to think of an advance directive as a gift to the people who care for you. If you have one, they won't have to make tough decisions by themselves.  Follow-up care is a key part of your treatment and safety. Be sure to make and go to all appointments, and call your doctor if you are having problems. It's also a good idea to know your test results and keep a list of the medicines you take.  How can you care for yourself at home?  ?? Discuss your wishes with your loved ones and your doctor. This way, there are no surprises.  ?? Many states have a unique form. Or you might use a universal form that  has been approved by many states. This kind of form can sometimes be completed and stored online. Your electronic copy will then be available wherever you have a connection to the Internet. In most cases, doctors will respect your wishes even if you have a form from a different state.  ?? You don't need a lawyer to do an advance directive. But you may want to get legal advice.  ?? Think about these questions when you prepare an advance directive:  ?? Who do you want to make decisions about your medical care if you are not able to? Many people choose a family member, close friend, or doctor.  ?? Do you know enough about life support methods that might be used? If not, talk to your doctor so you understand.   ?? What are you most afraid of that might happen? You might be afraid of having pain, losing your independence, or being kept alive by machines.  ?? Where would you prefer to die? Choices include your home, a hospital, or a nursing home.  ?? Would you like to have information about hospice care to support you and your family?  ?? Do you want to donate organs when you die?  ?? Do you want certain religious practices performed before you die? If so, put your wishes in the advance directive.  ?? Read your advance directive every year, and make changes as needed.  When should you call for help?  Be sure to contact your doctor if you have any questions.   Where can you learn more?   Go to [email or web address]  Enter R264 in the search box to learn more about "Advance Directives: Care Instructions."   ?? 2006-2015 [employer]. Care instructions adapted under license by [employer] (which disclaims liability or warranty for this information). This care instruction is for use with your licensed healthcare professional. If you have questions about a medical condition or this instruction, always ask your healthcare professional. [employer], Inc[employer] disclaims any warranty or liability for your use of this information.  Content Version: 10.7.482551; Current as of: April 07, 2013

## 2014-04-04 NOTE — Progress Notes (Signed)
1. Have you been to the ER, urgent care clinic since your last visit?  Hospitalized since your last visit?Yes When: January 31,2016 [facility] Reason for visit: right lower leg pain    2. Have you seen or consulted any other health care providers outside of the [facility] since your last visit?  Include any pap smears or colon screening. Yes When: January 04,2016 and on January 12,2016 Where: [facility]  Reason for visit: right lower leg wound and lower extremity/arterial study. See wound care weekly and on February 03,2016 Hematology/Oncology Dr. Arnette FeltsFranzman for history of mutiple DVT's     Annual eye exam: 11/09/2012  Pneumococcal vaccine: 05/03/2013  Flu vaccine: 11/01/2012  Patient instructed to remove shoes: Yes

## 2014-04-04 NOTE — Progress Notes (Signed)
SUBJECTIVE  Chief Complaint   Patient presents with   ??? Cholesterol Problem   ??? Hypertension   ??? Other     Pre-DM     Chronically medically noncomplaint patient here for follow-up.  Has not been seen for about 11 months.  He has been challenging to take care of for this reason.  [place]ays that [place] in [place] long time and was getting refills down there.      He has no acute concerns but has had a right LE ulceration that is being treated by wound care clinic.  He went to the ER for this in Jan.  Records are reviewed and scanned from 03/17/2014.      Patient presents for follow-up on his cholesterol and hypertension.  He reports compliance with atenolol and Lotrel.  He denies any side effects with the medication.   Says that he tolerated the statin well without myalgias or cramping for 6 months but he stopped when he ran out.  The patient denies any chest pain or chest tightness.  Denies any dyspnea upon exertion.  The patient denies any blurred vision or double vision.      Patient presents for follow-up on their prediabetes as well.  We also reviewed their cardiac risk factors. The patient is not checking home blood sugars.  The patient has seen the eye doctor in Sept 2014.  Denies any polyuria, polydipsia, or polyphagia.       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  No new complaints but has chronic pain.  Currently Dr. Linda HedgesFrantzman manages his meds.     ROS:  History obtained from the patient  ?? General: negative for - chills, fever, weight changes or malaise  ?? Respiratory: no cough, shortness of breath, or wheezing  ?? Cardiovascular: no chest pain, palpitations, or dyspnea on exertion  ?? Gastrointestinal: no abdominal pain, N/V, change in bowel habits, or black or bloody stools    OBJECTIVE    Blood pressure 138/90, pulse 67, temperature 98 ??F (36.7 ??C), temperature source Oral, resp. rate 16, height 6\' 5"  (1.956 m), weight 350 lb (158.759 kg), SpO2 98 %.   General:  Alert, cooperative, well appearing, in no apparent distress.  HEENT: no oral lesions.    Vasc:  No swelling.  No carotid bruits.  No JVD.  CV:  The heart sounds are regular in rate and rhythm.  There is a normal S1 and S2.  There or no murmurs.  Lungs:  Inspiratory and expiratory efforts are full and unlabored.  Lung sounds are clear and equal to auscultation throughout all lung fields without wheezing, rales, or rhonchi.  Prostate exam - declines.  Psych: normal affect.  Mood good.  Oriented x 3.  Judgement and insight intact.     Results for orders placed or performed in visit on 04/04/14   AMB POC HEMOGLOBIN A1C   Result Value Ref Range    Hemoglobin A1c (POC) 6.1 %     ASSESSMENT / PLAN    ICD-10-CM ICD-9-CM    1. Prediabetes R73.09 790.29 oxyCODONE-acetaminophen (PERCOCET 10) 10-325 mg per tablet      morphine CR (MS CONTIN) 15 mg CR tablet      fondaparinux (ARIXTRA) 10 mg/0.8 mL syrg      trimethoprim-sulfamethoxazole (BACTRIM DS, SEPTRA DS) 160-800 mg per tablet      AMB POC HEMOGLOBIN A1C      HEMOGLOBIN A1C      MICROALBUMIN, UR, RAND  CANCELED: AMB POC URINE, MICROALBUMIN, SEMIQUANTITATIVE   2. Hypercholesterolemia E78.0 272.0 METABOLIC PANEL, COMPREHENSIVE      LIPID PANEL      atorvastatin (LIPITOR) 40 mg tablet   3. Essential hypertension I10 401.9 METABOLIC PANEL, COMPREHENSIVE      LIPID PANEL      atenolol (TENORMIN) 100 mg tablet      amLODIPine-benazepril (LOTREL) 10-40 mg per capsule   4. Obesity, unspecified obesity severity, unspecified obesity type E66.9 278.00    5. DVT (deep venous thrombosis), unspecified laterality (HCC) I82.409 453.40    6. Colon cancer screening Z12.11 V76.51 REFERRAL TO GASTROENTEROLOGY   7. Colon polyp K63.5 211.3 REFERRAL TO GASTROENTEROLOGY   8. Prostate cancer screening Z12.5 V76.44 PSA SCREENING (SCREENING)     Prediabetes -  A1c is stable based on POC testing.  Diabetic dieting.  Check feet daily, eyes annually.  Check home blood sugars.      HTN - Continue Lotrel and atenolol at same doses.  DASH diet.     Microalbuminuria - continue Lotrel.      Hypercholesterolemia - restart Lipitor.  Check lipids and LFTs in 6 weeks.  Diet advised low in cholesterol.      Obesity - Counseled on diet.      DVT - continue follow-up with oncology.     Colon polyps / screening for colorectal cancer - Advised he is overdue to see GI per their notes.  I have referred him to GLS once again and have provided contact info.    Patient understands our medical plan. Patient has provided input and agrees with goals.  Alternatives have been explained and offered.  Risks/benefits and significant side effects of medications have been reviewed.  Patient encouraged to review package inserts for any medications. All questions answered.  The patient is to call if condition worsens or fails to improve.     Follow-up Disposition:  Return in about 6 months (around 10/03/2014) for routine care (BP and cholesterol).  Fasting labs due in 6 weeks.

## 2014-04-04 NOTE — Progress Notes (Signed)
This is a Subsequent Medicare Annual Wellness Visit providing Personalized Prevention Plan Services (PPPS) (Performed 12 months after initial AWV and PPPS )    I have reviewed the patient's medical history in detail and updated the computerized patient record.     History     Past Medical History   Diagnosis Date   ??? Hypertension    ??? 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 DVT (deep venous thrombosis) ([place])    ??? Thromboembolus ([place])      last one 5 to 6 months ago   ??? Unspecified sleep apnea      no CPAP   ??? GERD (gastroesophageal reflux disease)    ??? High cholesterol    ??? S/P colonoscopy with polypectomy 11/16/11     Dr. Sula Rumple; polyps x 3; otherwise normal colonoscopy; f/u with gastro in 1 year   ??? H/O esophagogastroduodenoscopy 11/16/11     Dr. Sula Rumple; normal stomach and duodenum; hiatal hernia in the cardia   ??? History of stress test 06/2012     [facility] - negative stress ECHO   ??? Cyst of left kidney      referred to urology   ??? Prediabetes       Past Surgical History   Procedure Laterality Date   ??? Pr abdomen surgery proc unlisted       ulcer in stomach removed   ??? Hx other surgical  3 years ago     Port A Cath, inserted and removed     Current Outpatient Prescriptions   Medication Sig Dispense Refill   ??? oxyCODONE-acetaminophen (PERCOCET 10) 10-325 mg per tablet Take 1 Tab by mouth every six (6) hours as needed.     ??? fondaparinux (ARIXTRA) 10 mg/0.8 mL syrg 10 mg by SubCUTAneous route two (2) times a day. Indications: DEEP VEIN THROMBOSIS PREVENTION     ??? trimethoprim-sulfamethoxazole (BACTRIM DS, SEPTRA DS) 160-800 mg per tablet Take 1 Tab by mouth two (2) times a day.     ??? atenolol (TENORMIN) 100 mg tablet Take 1 Tab by mouth daily. 30 Tab 3   ??? amLODIPine-benazepril (LOTREL) 10-40 mg per capsule Take 1 capsule by mouth daily. 30 capsule 5   ??? atorvastatin (LIPITOR) 40 mg tablet Take 1 tablet by mouth daily. 30 tablet 5   ??? ergocalciferol (VITAMIN D2) 50,000 unit capsule Take 50,000 Units by  mouth every Monday and Friday.     ??? zolpidem (AMBIEN) 10 mg tablet Take 10 mg by mouth nightly as needed.     ??? omeprazole (PRILOSEC) 20 mg capsule Take 20 mg by mouth daily.       ??? morphine CR (MS CONTIN) 15 mg CR tablet Take 15 mg by mouth every twelve (12) hours.  0   ??? glucose blood VI test strips (ASCENSIA AUTODISC VI, ONE TOUCH ULTRA TEST VI) strip Check fasting and 1 hour after eating as needed 1 Package 11   ??? Blood-Glucose Meter monitoring kit Use as directed 1 kit 0   ??? Lancets misc Use as directed 1 Package 11   ??? oxyCODONE-acetaminophen (PERCOCET) 5-325 mg per tablet Take 1 Tab by mouth every four (4) hours as needed.         No Known Allergies  Family History   Problem Relation Age of Onset   ??? Heart Attack Mother    ??? Hypertension Mother    ??? Arthritis-rheumatoid Mother    ??? Heart Failure Mother    ??? High  Cholesterol Mother    ??? Hypertension Father    ??? Alcohol abuse Father    ??? Other Father      Tobacco Use     History   Substance Use Topics   ??? Smoking status: Current Every Day Smoker -- 0.80 packs/day for 15 years     Types: Cigarettes   ??? Smokeless tobacco: Never Used   ??? Alcohol Use: No     Patient Active Problem List   Diagnosis Code   ??? HTN (hypertension) I10   ??? DVT (deep venous thrombosis) (HCC) I82.409   ??? Hypercholesterolemia E78.0   ??? Obesity E66.9   ??? Other dysphagia R13.19   ??? Special screening for malignant neoplasms, colon Z12.11   ??? Prediabetes R73.09       Depression Risk Factor Screening:     PHQ 2 / 9, over the last two weeks 04/04/2014   Little interest or pleasure in doing things Several days   Feeling down, depressed or hopeless Not at all   Total Score PHQ 2 1     Alcohol Risk Factor Screening:   On any occasion during the past 3 months, have you had more than 4 drinks containing alcohol?  No    Do you average more than 14 drinks per week?  No      Functional Ability and Level of Safety:     Hearing Loss   denies    Activities of Daily Living   Self-care.    Requires assistance with: no ADLs    Fall Risk     Fall Risk Assessment, last 12 mths 04/04/2014   Able to walk? Yes   Fall in past 12 months? No     Abuse Screen   Patient is not abused    Review of Systems   A comprehensive review of systems was negative except for that written in the HPI.    Physical Examination     Evaluation of Cognitive Function:  Mood/affect:  happy  Appearance: age appropriate and casually dressed  Family member/caregiver input: none present    Blood pressure 138/90, pulse 67, temperature 98 ??F (36.7 ??C), temperature source Oral, resp. rate 15, height $RemoveBe'6\' 5"'DLxpzynvD$  (1.956 m), weight 350 lb (158.759 kg), SpO2 98 %.    Patient Care Team:  Truitt Leep, MD as PCP - General (Family Practice)  Aris Georgia, MD (Hematology and Oncology)  Elio Forget, MD (Gastroenterology)  Darrin Nipper, MD (Orthopedic Surgery)  Daphene Calamity, MD (Ophthalmology)  Steva Colder, MD (Pain Management)    Advice/Referrals/Counseling   Education and counseling provided:  Are appropriate based on today's review and evaluation  End-of-Life planning (with patient's consent)    Assessment/Plan       ICD-10-CM ICD-9-CM    1. Routine general medical examination at a health care facility Z00.00 V70.0    2. Screening for depression Z13.89 V79.0    3. Prostate cancer screening Z12.5 V76.44      Age and sex specific counseling.    Screening for depression and alcoholism.     Advanced directives / end of life planning discussion and packet provided for review at home.     Care team updated.      Due for PSA testing and CRCS.      Medicare recommended screening and prevention test table is filled out, reviewed, and provided to patient..      Patient understands our medical plan. Patient has provided input and agrees with goals.  All questions answered.

## 2014-04-17 ENCOUNTER — Inpatient Hospital Stay: Admit: 2014-04-17 | Payer: MEDICARE

## 2014-04-17 DIAGNOSIS — L97812 Non-pressure chronic ulcer of other part of right lower leg with fat layer exposed: Secondary | ICD-10-CM

## 2014-04-20 ENCOUNTER — Inpatient Hospital Stay: Admit: 2014-04-20 | Payer: MEDICARE

## 2014-04-24 ENCOUNTER — Inpatient Hospital Stay: Admit: 2014-04-24 | Payer: MEDICARE

## 2014-05-01 ENCOUNTER — Inpatient Hospital Stay: Admit: 2014-05-01 | Payer: MEDICARE

## 2014-08-10 ENCOUNTER — Inpatient Hospital Stay
Admit: 2014-08-10 | Discharge: 2014-08-11 | Disposition: A | Discharge status: Home / Self Care | Payer: MEDICARE | Attending: Emergency Medicine

## 2014-08-10 DIAGNOSIS — M79604 Pain in right leg: Secondary | ICD-10-CM

## 2014-08-10 NOTE — ED Notes (Signed)
I have reviewed discharge instructions with the patient.  The patient verbalized understanding.    Medication teaching given, to include name, dose, action, and side effects. Patient verbalized understanding of medications. Encouraged patient to voice any concerns with reassurance provided.  Instructed not to drive or use heavy machinery after having prescription filled and while taking this medication.     Patient armband removed and shredded    Patient Discharged in stable condition.

## 2014-08-10 NOTE — ED Notes (Signed)
Pt c/o right lower leg pain that started 3 days ago. Reports hx of dvt

## 2014-08-10 NOTE — ED Provider Notes (Addendum)
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 The pain worsens when he ambulate and radiates down posterior leg and into all toes. Denies fever and chills,. trauma. However, he was placed on Keflex yesterday for left forearm spider bite. States the vein on top if his shin has always been there but now it is red and painful. He is concerned he now has a DVT in his right leg, which would be new for him. He was taken off of warfarin in the past due to he continued to clot. He was then placed on Xarelto, then stated he also clotted with that. He is now on fondaparinux.     The history is provided by the patient.        Past Medical History:   Diagnosis Date   ??? Hypertension    ??? 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 DVT (deep venous thrombosis) (HCC)    ??? Thromboembolus (HCC)      last one 5 to 6 months ago   ??? Unspecified sleep apnea      no CPAP   ??? GERD (gastroesophageal reflux disease)    ??? High cholesterol    ??? S/P colonoscopy with polypectomy 11/16/11     Dr. Eulogio Ditch; polyps x 3; otherwise normal colonoscopy; f/u with gastro in 1 year   ??? H/O esophagogastroduodenoscopy 11/16/11     Dr. Eulogio Ditch; normal stomach and duodenum; hiatal hernia in the cardia   ??? History of stress test 06/2012     [facility] - negative stress ECHO   ??? Cyst of left kidney      referred to urology   ??? Prediabetes        Past Surgical History:   Procedure Laterality Date   ??? Pr abdomen surgery proc unlisted       ulcer in stomach removed   ??? Hx other surgical  3 years ago     Port A Cath, inserted and removed         Family History:   Problem Relation Age of Onset   ??? Heart Attack Mother    ??? Hypertension Mother    ??? Arthritis-rheumatoid Mother    ??? Heart Failure Mother    ??? High Cholesterol Mother    ??? Hypertension Father    ??? Alcohol abuse Father    ??? Other Father      Tobacco Use       History      Social History   ??? Marital Status: SINGLE     Spouse Name: N/A   ??? Number of Children: N/A   ??? Years of Education: N/A     Occupational History   ??? disability      Social History Main Topics   ??? Smoking status: Current Every Day Smoker -- 0.80 packs/day for 15 years     Types: Cigarettes   ??? Smokeless tobacco: Never Used   ??? Alcohol Use: No   ??? Drug Use: No   ??? Sexual Activity:     Partners: Female     Other Topics Concern   ??? Not on file     Social History Narrative         ALLERGIES: Review of patient's allergies indicates no known allergies.    Review of Systems   Constitutional: Negative.  Negative  for fever, chills and fatigue.   HENT: Negative.  Negative for congestion and voice change.    Eyes: Negative.  Negative for visual disturbance.   Respiratory: Negative.  Negative for cough, choking, chest tightness, shortness of breath and wheezing.    Cardiovascular: Negative.  Negative for chest pain and palpitations.   Gastrointestinal: Negative.  Negative for nausea, vomiting, abdominal pain, diarrhea, constipation, blood in stool and abdominal distention.   Endocrine: Negative.  Negative for polyuria.   Genitourinary: Negative.  Negative for dysuria, urgency, frequency, hematuria, flank pain, decreased urine volume, discharge, penile swelling, scrotal swelling, difficulty urinating, genital sores, penile pain and testicular pain.   Musculoskeletal:        Right leg pain   Skin: Negative.    Neurological: Negative.  Negative for dizziness, tremors, seizures, syncope, facial asymmetry, speech difficulty, weakness, light-headedness, numbness and headaches.   Hematological: Negative.    Psychiatric/Behavioral: Negative.        Filed Vitals:    08/10/14 1732 08/10/14 1913 08/10/14 2000   BP: 150/89 133/82 142/78   Pulse: 87 78 75   Temp: 97.7 ??F (36.5 ??C)     Resp: Height:  (1.956 m)     Weight: 146.965 kg (324 lb)     SpO2: 100% 98% 99%            Physical Exam    Constitutional: He is oriented to person, place, and time. He appears well-developed and well-nourished. No distress.   HENT:   Head: Normocephalic and atraumatic.   Eyes: EOM are normal. Right eye exhibits no discharge. Left eye exhibits no discharge.   Neck: Neck supple. No JVD present. No tracheal deviation present.   Cardiovascular: Normal rate, regular rhythm, normal heart sounds and intact distal pulses.  Exam reveals no gallop and no friction rub.    No murmur heard.  Pulmonary/Chest: Effort normal and breath sounds normal. No respiratory distress. He has no wheezes. He has no rales. He exhibits no tenderness.   Abdominal: Soft. Bowel sounds are normal. He exhibits no distension and no mass. There is no tenderness. There is no guarding.   Musculoskeletal: He exhibits no edema.   Negative homans BL    Neurological: He is alert and oriented to person, place, and time.   Skin: Skin is warm and dry. He is not diaphoretic.   Right anterior leg shows erythema with varicose vein without warmth with TTP   DP/PT pulse is 2+ BL   Psychiatric: He has a normal mood and affect. His behavior is normal. Thought content normal.   Nursing note and vitals reviewed.       MDM  Number of Diagnoses or Management Options  Diagnosis management comments: Impression: Right leg pain with erythema without warmth. He is concerned about now having DVT in the right leg, as he has failed two other anticoagulations. Negative homans BL. He is currently on keflex. Freddrick March, PA-C 6:25 PM    Progress note: Patient discussed with Dr. Lilian Kapur, whom will now assume care over patient. Freddrick March, PA-C 7:07 PM               Amount and/or Complexity of Data Reviewed  Tests in the radiology section of CPT??: ordered and reviewed  Review and summarize past medical records: yes    Risk of Complications, Morbidity, and/or Mortality  Presenting problems: moderate  Diagnostic procedures: moderate  Management options: moderate     Patient  Progress  Patient progress: stable      Procedures  ??  INTERPRETATION/FINDINGS  Duplex images were obtained using 2-D gray scale, color flow and  spectral doppler analysis.  Right?? :  1. 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  2. Deep veins visualized include the common femoral, femoral,  popliteal, distal posterior tibial and distal peroneal veins.  Compression analysis perfomed in these segments.  3. Proximal to mid posterior tibial and proximal to mid peroneal veins  ??not visualized due to edema.?? Therefore, deep venous thrombosis  cannot be ruled out in these segments.  4. Pulsatile flow detected.  5. No evidence of deep venous thrombosis in the contralateral common  femoral vein.?? Pulsatile flow detected.  ??  ADDITIONAL COMMENTS  Technically difficult study due to edema.?? Sub-optimal imaging.?? Poor  ultrasound penetration.  No significant change since prior exam on 02/19/14.    I was personally available for consultation in the emergency department. I have reviewed the chart prior to the patient's discharge and agree with the documentation recorded by the [facility], including the assessment, treatment plan, and disposition.

## 2014-08-10 NOTE — ED Notes (Signed)
Bedside and Verbal shift change report given to Delrae Rend, RN (oncoming nurse) by Fernanda Drum RN (offgoing nurse). Report included the following information SBAR, Kardex, MAR and Recent Results.

## 2014-08-10 NOTE — Progress Notes (Signed)
Right lower extremity venous duplex completed.

## 2014-08-11 MED ORDER — TRIMETHOPRIM-SULFAMETHOXAZOLE 160 MG-800 MG TAB
160-800 mg | ORAL_TABLET | Freq: Two times a day (BID) | ORAL | Status: AC
Start: 2014-08-11 — End: 2014-08-17

## 2014-08-11 MED ORDER — OXYCODONE-ACETAMINOPHEN 10 MG-325 MG TAB
10-325 mg | ORAL_TABLET | Freq: Four times a day (QID) | ORAL | Status: DC | PRN
Start: 2014-08-11 — End: 2014-08-15

## 2014-08-11 NOTE — Procedures (Signed)
[facility]  *** FINAL REPORT ***    Name: BREVIN, DURKEE  MRN: [id number]  DOB: February 08, 1961  HIS Order #: [id number]  TRAKnet Visit #: [id number]  Date: 10 Aug 2014    TYPE OF TEST: Peripheral Venous Testing    REASON FOR TEST  Pain in limb    Right Leg:-  Deep venous thrombosis:           Yes  Proximal extent of thrombus:      Ulnar  Superficial venous thrombosis:    No      INTERPRETATION/FINDINGS  Duplex images were obtained using 2-D gray scale, color flow and  spectral doppler analysis.  Right  :  1. 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  2. Deep veins visualized include the common femoral, femoral,  popliteal, distal posterior tibial and distal peroneal veins.  3. Proximal to mid posterior tibial and proximal to mid peroneal veins   not visualized due to edema.  Therefore, deep venous thrombosis  cannot be ruled out in these segments.  4. Pulsatile flow detected.  5. No evidence of deep venous thrombosis in the contralateral common  femoral vein.  Pulsatile flow detected.    ADDITIONAL COMMENTS  Technically difficult and limited study due to edema.  Sub-optimal  imaging.  Poor ultrasound penetration.  No significant change since prior exam on 02/19/14.    I have personally reviewed the data relevant to the interpretation of  this  study.    TECHNOLOGIST: Denny Peon, RVT  Signed: 08/10/2014 08:42 PM    PHYSICIAN: Vernia Buff A. Lorine Bears, MD  Signed: 08/11/2014 10:26 AM

## 2014-08-11 NOTE — Procedures (Signed)
[facility]  *** FINAL REPORT ***    Name: [name], Sean  MRN: [id number]  DOB: 18 Apr 1960  HIS Order #: [id number]  TRAKnet Visit #: [id number]  Date: 10 Aug 2014    TYPE OF TEST: Peripheral Venous Testing    REASON FOR TEST  Pain in limb    Right Leg:-  Deep venous thrombosis:           Yes  Proximal extent of thrombus:      Ulnar  Superficial venous thrombosis:    No      INTERPRETATION/FINDINGS  Duplex images were obtained using 2-D gray scale, color flow and  spectral doppler analysis.  Right  :  1. 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  2. Deep veins visualized include the common femoral, femoral,  popliteal, distal posterior tibial and distal peroneal veins.  3. Proximal to mid posterior tibial and proximal to mid peroneal veins   not visualized due to edema.  Therefore, deep venous thrombosis  cannot be ruled out in these segments.  4. Pulsatile flow detected.  5. No evidence of deep venous thrombosis in the contralateral common  femoral vein.  Pulsatile flow detected.    ADDITIONAL COMMENTS  Technically difficult and limited study due to edema.  Sub-optimal  imaging.  Poor ultrasound penetration.  No significant change since prior exam on 02/19/14.    I have personally reviewed the data relevant to the interpretation of  this  study.    TECHNOLOGIST: Jennifer Tempesco, RVT  Signed: 08/10/2014 08:42 PM    PHYSICIAN: Lianette Broussard A. Keirah Konitzer, MD  Signed: 08/11/2014 10:26 AM

## 2014-08-13 NOTE — Progress Notes (Signed)
Patient on discharge report dated 08/10/14 from [facility] (DX: Right Leg Pain, Cellulitis of Right Lower Extremity. Spoke with the patient after personal identifiers noted (Name/DOB). The patient reported that he visited [facility] ED on 08/08/14 for c/o left arm spider bite. The patient conveyed that left spider bite on left arm remains tender to touch. The patient stated that he continues to also experience right lower extremity pain with some redness. The patient reported that he has not experienced fever elevation, chills, diaphoresis since discharge from [facility] on 08/10/14. The patient conveyed that he has not picked up his new prescribed antibiotic therapy from his local pharmacy as previously prescribed by ER providers. The patient stated that he did not obtain his antibiotic prescription from the pharmacy as of yet since he was concerned about the cost factor of the medication. This nurse navigator called the patient's pharmacy [address], [address], [place], [place] [phone]) and per the pharmacy representative, the patient's co-pay for prescription medications recently ordered by ER providers should be = or < than $10.00. Per the [facility] pharmacy representative, Keflex prescription co-pay is approximately $6.00 with current insurance. The patient was notified of the information regarding the approximate low cost of new prescriptions that are applicable to him with current insurance. The patient stated that he will attempt to obtain prescriptions from the pharmacy ASAP. The patient was advised of the importance of medical compliance (specifically medication compliance) to prevent further complications. The patient accepted a future PCP appointment on 08/14/14 at 10:00am regarding f/u for recent ER visits.

## 2014-08-14 ENCOUNTER — Ambulatory Visit: Admit: 2014-08-14 | Discharge: 2014-08-14 | Payer: MEDICARE | Attending: Family Medicine

## 2014-08-14 DIAGNOSIS — L089 Local infection of the skin and subcutaneous tissue, unspecified: Secondary | ICD-10-CM

## 2014-08-14 NOTE — Patient Instructions (Addendum)
Skin Abscess: Care Instructions  Your Care Instructions     A skin abscess is a bacterial infection that forms a pocket of pus. A boil is a kind of skin abscess. The doctor may have cut an opening in the abscess so that the pus can drain out. You may have gauze in the cut so that the abscess will stay open and keep draining. You may need antibiotics. You will need to follow up with your doctor to make sure the infection has gone away.  The doctor has checked you carefully, but problems can develop later. If you notice any problems or new symptoms, get medical treatment right away.  Follow-up care is a key part of your treatment and safety. Be sure to make and go to all appointments, and call your doctor if you are having problems. It's also a good idea to know your test results and keep a list of the medicines you take.  How can you care for yourself at home?  ?? Apply warm and dry compresses, a heating pad set on low, or a hot water bottle 3 or 4 times a day for pain. Keep a cloth between the heat source and your skin.  ?? If your doctor prescribed antibiotics, take them as directed. Do not stop taking them just because you feel better. You need to take the full course of antibiotics.  ?? Take pain medicines exactly as directed.  ?? If the doctor gave you a prescription medicine for pain, take it as prescribed.  ?? If you are not taking a prescription pain medicine, ask your doctor if you can take an over-the-counter medicine.  ?? Keep your bandage clean and dry. Change the bandage whenever it gets wet or dirty, or at least one time a day.  ?? If the abscess was packed with gauze:  ?? Keep follow-up appointments to have the gauze changed or removed. If the doctor instructed you to remove the gauze, gently pull out all of the gauze when your doctor tells you to.  ?? After the gauze is removed, soak the area in warm water for 15 to 20 minutes 2 times a day, until the wound closes.  When should you call for help?   Call your doctor now or seek immediate medical care if:  ?? You have signs of worsening infection, such as:  ?? Increased pain, swelling, warmth, or redness.  ?? Red streaks leading from the infected skin.  ?? Pus draining from the wound.  ?? A fever.  Watch closely for changes in your health, and be sure to contact your doctor if:  ?? You do not get better as expected.  Where can you learn more?  Go to http://www.healthwise.net/GoodHelpConnections  Enter [phone] in the search box to learn more about "Skin AbscesInsuranceStats.cas: Care Instructions."  ?? 2006-2016 Healthwise, Inc[employer]. Care instructions adapted under license by [employer] (which disclaims liability or warranty for this information). This care instruction is for use with your licensed healthcare professional. If you have questions about a medical condition or this instruction, always ask your healthcare professional. Healthwise, Incorporated disclaims any warranty or liability for your use of this information.  Content Version: 10.9.538570; Current as of: March 23, 2014      [facility]  [address]  [place], [place] [id number]  [phone]  Appointment: 08/15/14 @ 9:30 am with Dr. Thad Rangerharles Williams

## 2014-08-14 NOTE — Progress Notes (Signed)
Chief Complaint   Patient presents with   ??? Hospital Follow Up      [facility] 08/08/14 for spider bite left forearm and [facility] 08/08/14 for right leg cellulitis       Patient completed course of Keflex but has not started Bactrim DS.    1. Have you been to the ER, urgent care clinic since your last visit?  Hospitalized since your last visit? Yes    2. Have you seen or consulted any other health care providers outside of the [facility] since your last visit?  Include any pap smears or colon screening. Yes, Dr. Arnette FeltsFranzman

## 2014-08-14 NOTE — Progress Notes (Signed)
SUBJECTIVE  Chief Complaint   Patient presents with   ??? Hospital Follow Up     [facility] 08/08/14 for spider bite left forearm and [facility] 08/10/14 for right leg cellulitis     Patient presents for hospital follow up.  We reviewed the recent hospitalization in detail.    Was seen at [facility] for a left forearm abscess that he believes was from a spider bite that he witnessed.  He says he had an I&D but the notes do not indicate that.  He says it did drain at home as well.   He has not had any fevers and the area looks much better.  It is only itchy.  He says he never took the antibiotics they prescribed.   He then went to the ER for concern of a right lower ext DVT due to pain and swelling of the anterior tibia.  He was placed on bactrim for a suspected cellulitis abscess that he has also not started due to money.     OBJECTIVE    Blood pressure 120/82, pulse 88, temperature 98.6 ??F (37 ??C), temperature source Oral, resp. rate 16, height 6\' 5"  (1.956 m), weight 331 lb (150.141 kg), SpO2 96 %.  General:  Alert, cooperative, well appearing, in no apparent distress.  CV:  The heart sounds are regular in rate and rhythm.  There is a normal S1 and S2.   Lungs:  Inspiratory and expiratory efforts are full and unlabored.  Lung sounds are clear and equal to auscultation throughout all lung fields without wheezing, rales, or rhonchi.  Skin:  Left forearm with a scabbed over lesions with skin peeling.  The area in the center has 1cm of induration that is slightly tender.  He has swelling and two tender loculated fluctuant and tense areas on his right anterior shin.  He has no warmth. The leg is hyperpigmented from mid-tibia downwards.    ASSESSMENT / PLAN    ICD-10-CM ICD-9-CM    1. Infected insect bite of left arm, initial encounter S40.862A 913.5     L08.9 E906.4    2. Abscess of arm, left L02.414 682.3    3. Abscess of right leg L02.415 682.6 REFERRAL TO GENERAL SURGERY      [facility] and [facility].  ER records reviewed in chart from [facility].    Infected bite - seems to be improved but he has a small 1cm area of induration.  He will start his antibiotics (mainly for his leg) and monitor this to resolution.     Abscess of right leg  - refer to general surgery.  Advised to start his Bactrim ASAP as he says he has the funds if needed.    15 minutes of face to face time spent with the patient with at least 50% on counseling on above medical issues.    Patient understands our medical plan. Patient has provided input and agrees with goals.  Alternatives have been explained and offered.  Risks/benefits and significant side effects of medications have been reviewed.  Patient encouraged to review package inserts for any medications. All questions answered.  The patient is to call if condition worsens or fails to improve.     Follow-up Disposition:  Return Wednesday, October 03, 2014 02:30 PM for routine care (already scheduled).

## 2014-08-15 ENCOUNTER — Ambulatory Visit: Admit: 2014-08-15 | Discharge: 2014-08-15 | Payer: MEDICARE | Attending: Surgery

## 2014-08-15 DIAGNOSIS — I8001 Phlebitis and thrombophlebitis of superficial vessels of right lower extremity: Secondary | ICD-10-CM

## 2014-08-15 MED ORDER — IBUPROFEN 600 MG TAB
600 mg | ORAL_TABLET | Freq: Three times a day (TID) | ORAL | Status: DC | PRN
Start: 2014-08-15 — End: 2014-12-17

## 2014-08-15 MED ORDER — OXYCODONE-ACETAMINOPHEN 5 MG-325 MG TAB
5-325 mg | ORAL_TABLET | ORAL | Status: DC
Start: 2014-08-15 — End: 2014-12-17

## 2014-08-15 NOTE — Progress Notes (Signed)
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  He is referred by the [facility] emergency department for evaluation and management of an abscess of the right anterior leg. The patient states that he has had a mass on the anterior surface of the left leg for several years. It became tender and sore painful last week. He did undergo a duplex ultrasonography emergency department where he was found to not have any new clots in the right leg. No abscess or cyst were noted either.  Patient denies fever or chills.    Patient Active Problem List    Diagnosis Date Noted   ??? Advance care planning 04/04/2014   ??? Prediabetes 03/23/2013   ??? Other dysphagia 11/16/2011   ??? Special screening for malignant neoplasms, colon 11/16/2011   ??? Hypercholesterolemia 10/31/2011   ??? Obesity 10/31/2011   ??? HTN (hypertension) 08/10/2011   ??? DVT (deep venous thrombosis) ([place]) 08/10/2011     Past Medical History   Diagnosis Date   ??? Hypertension    ??? 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 DVT (deep venous thrombosis) ([place])    ??? Thromboembolus ([place])      last one 5 to 6 months ago   ??? Unspecified sleep apnea      no CPAP   ??? GERD (gastroesophageal reflux disease)    ??? High cholesterol    ??? S/P colonoscopy with polypectomy 11/16/11     Dr. Sula Rumple; polyps x 3; otherwise normal colonoscopy; f/u with gastro in 1 year   ??? H/O esophagogastroduodenoscopy 11/16/11     Dr. Sula Rumple; normal stomach and duodenum; hiatal hernia in the cardia   ??? History of stress test 06/2012     [facility] [place] - negative stress ECHO   ??? Cyst of left kidney      referred to urology   ??? Prediabetes       Past Surgical History   Procedure Laterality Date   ??? Pr abdomen surgery proc unlisted        ulcer in stomach removed   ??? Hx other surgical  3 years ago     Port A Cath, inserted and removed      Family History   Problem Relation Age of Onset   ??? Heart Attack Mother    ??? Hypertension Mother    ??? Arthritis-rheumatoid Mother    ??? Heart Failure Mother    ??? High Cholesterol Mother    ??? Hypertension Father    ??? Alcohol abuse Father    ??? Other Father      Tobacco Use      History   Substance Use Topics   ??? Smoking status: Current Every Day Smoker -- 0.80 packs/day for 15 years     Types: Cigarettes   ??? Smokeless tobacco: Never Used   ??? Alcohol Use: No      No Known Allergies    Prior to Admission medications    Medication Sig Start Date End Date Taking? Authorizing Provider   oxyCODONE-acetaminophen (PERCOCET) 5-325 mg per tablet 1-2 tablets every 4-6 hours prn pain 08/15/14  Yes Lia Hopping, MD   ibuprofen (MOTRIN) 600 mg tablet Take 1 Tab by mouth three (3) times daily as needed for  Pain. 08/15/14  Yes Lia Hopping, MD   trimethoprim-sulfamethoxazole (BACTRIM DS) 160-800 mg per tablet Take 1 Tab by mouth two (2) times a day for 7 days. 08/10/14 08/17/14  Cleta Alberts, MD   fondaparinux (ARIXTRA) 10 mg/0.8 mL syrg 7.5 mg by SubCUTAneous route two (2) times a day. Indications: DEEP VEIN THROMBOSIS PREVENTION    Historical Provider   atenolol (TENORMIN) 100 mg tablet Take 1 Tab by mouth daily. 04/04/14   Truitt Leep, MD   amLODIPine-benazepril (LOTREL) 10-40 mg per capsule Take 1 Cap by mouth daily. 04/04/14   Truitt Leep, MD   atorvastatin (LIPITOR) 40 mg tablet Take 1 Tab by mouth daily. 04/04/14   Truitt Leep, MD   ergocalciferol (VITAMIN D2) 50,000 unit capsule Take 50,000 Units by mouth every Monday and Friday.    Historical Provider   glucose blood VI test strips (ASCENSIA AUTODISC VI, ONE TOUCH ULTRA TEST VI) strip Check fasting and 1 hour after eating as needed 11/09/12   Truitt Leep, MD   Blood-Glucose Meter monitoring kit Use as directed 11/09/12   Truitt Leep, MD    Lancets misc Use as directed 11/09/12   Truitt Leep, MD   zolpidem (AMBIEN) 10 mg tablet Take 10 mg by mouth nightly as needed.    Phys Other, MD   omeprazole (PRILOSEC) 20 mg capsule Take 20 mg by mouth daily.      Phys Other, MD       Review of Systems:    14 systems were reviewed.  The results are as above in the HPI and otherwise negative.     Objective:     Filed Vitals:    08/15/14 0947   BP: 120/70   Pulse: 70   Temp: 97.6 ??F (36.4 ??C)   TempSrc: Oral   Resp: 16   Weight: 150.141 kg (331 lb)   SpO2: 97%       Physical Exam:  GENERAL: alert, cooperative, no distress, appears stated age,   EYE: conjunctivae/corneas clear. PERRL, EOM's intact. Fundi benign,  THROAT & NECK: normal and no erythema or exudates noted. ,    LYMPHATIC: Cervical, supraclavicular, and axillary nodes normal. ,   LUNG: clear to auscultation bilaterally,   HEART: regular rate and rhythm, S1, S2 normal, no murmur, click, rub or gallop,   ABDOMEN: soft, non-tender. Bowel sounds normal. No masses,  no organomegaly,   EXTREMITIES:  extremities normal, atraumatic, no cyanosis or edema,   SKIN: Normal.,   NEUROLOGIC: AOx3. Cranial nerves 2-12 and sensation grossly intact.,     Data Review:  As in history of present illness    Impression:     ?? Patient with superficial thrombophlebitis of the right anterior leg.    Plan:     ?? Agree with ibuprofen 600 mg  T.i.d. P.r.n.   ?? Agree with Bactrim double strength  ?? Followup in 2 weeks    Signed By: Lia Hopping, MD     August 15, 2014

## 2014-08-15 NOTE — Progress Notes (Signed)
Review of Systems   Respiratory: Positive for shortness of breath.    Gastrointestinal: Positive for heartburn.   Genitourinary: Positive for urgency.   Musculoskeletal: Positive for back pain and joint pain.   Neurological: Positive for headaches.   Psychiatric/Behavioral: Positive for depression.

## 2014-09-05 ENCOUNTER — Ambulatory Visit: Admit: 2014-09-05 | Discharge: 2014-09-05 | Payer: MEDICARE | Attending: Surgery

## 2014-09-05 DIAGNOSIS — I8001 Phlebitis and thrombophlebitis of superficial vessels of right lower extremity: Secondary | ICD-10-CM

## 2014-09-05 NOTE — Progress Notes (Signed)
Patient here for follow up on leg pain. States when he walks it is an 8. If the patient remains sitting, pain is a 5 to 6.  No other complaints.  [facility], LPN

## 2014-09-05 NOTE — Progress Notes (Signed)
Patient without complaints.  He states that the tenderness and pain is still present multiple he walks at the right anterior leg thrombophlebitis. He admits that he only takes the ibuprofen and every now and then.  Filed Vitals:    09/05/14 1420   BP: 130/99   Pulse: 82   Temp: 98.2 ??F (36.8 ??C)   TempSrc: Oral   Resp: 18   Height: 6\' 5"  (1.956 m)   Weight: 146.965 kg (324 lb)   SpO2: 99%     Exam:  Awake and alert, oriented x4, no apparent distress  Right anterior leg with thrombophlebitis overlying the shin. No overlying cellulitis no surrounding fluctuants or crepitance.    Impression and plan:  Patient with superficial thrombophlebitis of the leg  Followup on an as-needed basis no surgical intervention needed

## 2014-09-28 ENCOUNTER — Encounter

## 2014-09-28 NOTE — Addendum Note (Signed)
Addended by: Edwena BundeMOORE, Brittish Bolinger P on: 09/28/2014 09:49 AM      Modules accepted: Orders

## 2014-09-28 NOTE — Telephone Encounter (Signed)
Spoke with patient.  He was advised that he is due for fasting labs prior to 10/03/14 appointment.  He was advised of HBV lab hours of operation. He voices understanding.

## 2014-09-28 NOTE — Addendum Note (Signed)
Addended by: Edwena Bunde on: 09/28/2014 09:50 AM      Modules accepted: Orders

## 2014-10-03 ENCOUNTER — Encounter: Attending: Family Medicine

## 2014-12-13 ENCOUNTER — Inpatient Hospital Stay: Admit: 2014-12-13 | Payer: MEDICARE

## 2014-12-13 DIAGNOSIS — E7800 Pure hypercholesterolemia, unspecified: Secondary | ICD-10-CM

## 2014-12-13 LAB — LIPID PANEL
CHOL/HDL Ratio: 6.7 — ABNORMAL HIGH (ref 0–5.0)
Cholesterol, total: 274 MG/DL — ABNORMAL HIGH (ref <200)
HDL Cholesterol: 41 MG/DL (ref 40–60)
LDL, calculated: 164.8 MG/DL — ABNORMAL HIGH (ref 0–100)
Triglyceride: 341 MG/DL — ABNORMAL HIGH (ref <150)
VLDL, calculated: 68.2 MG/DL

## 2014-12-13 LAB — METABOLIC PANEL, COMPREHENSIVE
A-G Ratio: 1 (ref 0.8–1.7)
ALT (SGPT): 20 U/L (ref 16–61)
AST (SGOT): 8 U/L — ABNORMAL LOW (ref 15–37)
Albumin: 3.9 g/dL (ref 3.4–5.0)
Alk. phosphatase: 111 U/L (ref 45–117)
Anion gap: 7 mmol/L (ref 3.0–18)
BUN/Creatinine ratio: 13 (ref 12–20)
BUN: 14 MG/DL (ref 7.0–18)
Bilirubin, total: 0.4 MG/DL (ref 0.2–1.0)
CO2: 27 mmol/L (ref 21–32)
Calcium: 9.3 MG/DL (ref 8.5–10.1)
Chloride: 105 mmol/L (ref 100–108)
Creatinine: 1.06 MG/DL (ref 0.6–1.3)
GFR est AA: >60 mL/min/{1.73_m2} (ref >60)
GFR est non-AA: >60 mL/min/{1.73_m2} (ref >60)
Globulin: 3.9 g/dL (ref 2.0–4.0)
Glucose: 117 mg/dL — ABNORMAL HIGH (ref 74–99)
Potassium: 4.2 mmol/L (ref 3.5–5.5)
Protein, total: 7.8 g/dL (ref 6.4–8.2)
Sodium: 139 mmol/L (ref 136–145)

## 2014-12-13 LAB — HEMOGLOBIN A1C WITH EAG
Est. average glucose: 123 mg/dL
Hemoglobin A1c: 5.9 % — ABNORMAL HIGH (ref 4.2–5.6)

## 2014-12-14 LAB — PSA SCREENING (SCREENING): Prostate Specific Ag: 3 ng/mL (ref 0.0–4.0)

## 2014-12-17 ENCOUNTER — Encounter: Attending: Family Medicine

## 2014-12-17 ENCOUNTER — Ambulatory Visit: Admit: 2014-12-17 | Discharge: 2014-12-17 | Payer: MEDICARE | Attending: Family Medicine

## 2014-12-17 DIAGNOSIS — I10 Essential (primary) hypertension: Secondary | ICD-10-CM

## 2014-12-17 MED ORDER — AMLODIPINE-BENAZEPRIL 10 MG-40 MG CAP
10-40 mg | ORAL_CAPSULE | Freq: Every day | ORAL | 0 refills | Status: DC
Start: 2014-12-17 — End: 2015-05-30

## 2014-12-17 MED ORDER — ATENOLOL 100 MG TAB
100 mg | ORAL_TABLET | Freq: Every day | ORAL | 0 refills | Status: DC
Start: 2014-12-17 — End: 2015-05-30

## 2014-12-17 MED ORDER — ATORVASTATIN 40 MG TAB
40 mg | ORAL_TABLET | Freq: Every day | ORAL | 0 refills | Status: DC
Start: 2014-12-17 — End: 2015-05-30

## 2014-12-17 NOTE — Progress Notes (Signed)
SUBJECTIVE  Chief Complaint   Patient presents with   ??? Other     prediabetes   ??? Hypertension   ??? Cholesterol Problem   ??? Results     review     Chronically medically noncomplaint patient here for follow-up.  He was a no-show in August but is here for clearance for a dental procedure today.  It is an elective procedure.   He has been challenging to take care of due to his repeat noncompliance.    Patient presents for follow-up on his cholesterol and hypertension.  It is unclear if he is compliant with atenolol, Lotrel, and atorvastatin.  He says he is off atenolol and atrovastatin but is not confident.  He denies any side effects with the medication, just saying he ran out.   Says that he tolerated the statin well without myalgias or cramping in the past but he usually stops when he runs out and doesn't come in for follow-up.  The patient denies any chest pain or chest tightness.  Denies any dyspnea upon exertion.  The patient denies any blurred vision or double vision.      Patient presents for follow-up on their prediabetes as well.  We also reviewed their cardiac risk factors. The patient is not checking home blood sugars.  Denies any polyuria, polydipsia, or polyphagia.       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  He says he is in chronic pain from this and needs to see pain management.  Says that he was supposed to see them in the past but never went.   He says that he also needs to find a new hematologist since his is leaving.      ROS:  History obtained from the patient  ?? General: negative for - chills, fever, weight changes or malaise  ?? Respiratory: no cough, shortness of breath, or wheezing  ?? Cardiovascular: no chest pain, palpitations, or dyspnea on exertion  ?? Gastrointestinal: no abdominal pain, N/V, change in bowel habits, or black or bloody stools    OBJECTIVE    Blood pressure (!) 162/98, pulse 96, temperature 98.5 ??F (36.9 ??C),  temperature source Oral, resp. rate 14, height 6' 5" (1.956 m), weight 336 lb (152.4 kg), SpO2 95 %.  General:  Alert, cooperative, well appearing, in no apparent distress.  HEENT: no oral lesions.    Vasc:  No swelling.  No carotid bruits.  No JVD.  CV:  The heart sounds are regular in rate and rhythm.  There is a normal S1 and S2.  There or no murmurs.  Lungs:  Inspiratory and expiratory efforts are full and unlabored.  Lung sounds are clear and equal to auscultation throughout all lung fields without wheezing, rales, or rhonchi.  Psych: normal affect.  Mood good.  Oriented x 3.  Judgement and insight intact.     Results for orders placed or performed during the hospital encounter of 12/13/14   LIPID PANEL   Result Value Ref Range    LIPID PROFILE          Cholesterol, total 274 (H) <200 MG/DL    Triglyceride 341 (H) <150 MG/DL    HDL Cholesterol 41 40 - 60 MG/DL    LDL, calculated 164.8 (H) 0 - 100 MG/DL    VLDL, calculated 68.2 MG/DL    CHOL/HDL Ratio 6.7 (H) 0 - 5.0     METABOLIC PANEL, COMPREHENSIVE   Result Value Ref Range    Sodium 139  136 - 145 mmol/L    Potassium 4.2 3.5 - 5.5 mmol/L    Chloride 105 100 - 108 mmol/L    CO2 27 21 - 32 mmol/L    Anion gap 7 3.0 - 18 mmol/L    Glucose 117 (H) 74 - 99 mg/dL    BUN 14 7.0 - 18 MG/DL    Creatinine 1.06 0.6 - 1.3 MG/DL    BUN/Creatinine ratio 13 12 - 20      GFR est AA >60 >60 ml/min/1.76m    GFR est non-AA >60 >60 ml/min/1.757m   Calcium 9.3 8.5 - 10.1 MG/DL    Bilirubin, total 0.4 0.2 - 1.0 MG/DL    ALT 20 16 - 61 U/L    AST 8 (L) 15 - 37 U/L    Alk. phosphatase 111 45 - 117 U/L    Protein, total 7.8 6.4 - 8.2 g/dL    Albumin 3.9 3.4 - 5.0 g/dL    Globulin 3.9 2.0 - 4.0 g/dL    A-G Ratio 1.0 0.8 - 1.7     PSA SCREENING (SCREENING)   Result Value Ref Range    Prostate Specific Ag 3.0 0.0 - 4.0 ng/mL   HEMOGLOBIN A1C   Result Value Ref Range    Hemoglobin A1c 5.9 (H) 4.2 - 5.6 %    Est. average glucose 123 mg/dL     ASSESSMENT / PLAN    ICD-10-CM ICD-9-CM     1. Essential hypertension I10 401.9 atenolol (TENORMIN) 100 mg tablet      amLODIPine-benazepril (LOTREL) 10-40 mg per capsule   2. Prediabetes R73.03 790.29    3. Hypercholesterolemia E78.00 272.0 atorvastatin (LIPITOR) 40 mg tablet   4. Obesity, unspecified obesity severity, unspecified obesity type E66.9 278.00    5. Deep vein thrombosis (DVT) of proximal lower extremity, unspecified chronicity, unspecified laterality (HCC) I82.4Y9 453.41 REFERRAL TO PAIN MANAGEMENT   6. Smoking F17.200 305.1 PR SMOKING AND TOBACCO USE CESSATION 3 - 10 MINUTES   7. Medically noncompliant Z91.19 V15.81      Reviewed labs.    HTN - Uncontrolled due to noncompliance.  Advised to take Lotrel and atenolol at same doses and return for BP check this week.  DASH diet.  I cannot clear him for his dental procedure.     Prediabetes -  A1c is stable.  Diabetic dieting.  Check feet daily, eyes annually.  Check home blood sugars.     Hypercholesterolemia - Uncontrolled due to noncompliance.  Advised to restart Lipitor.      Obesity - Counseled on diet.      DVT - continue follow-up with oncology.   Will refer to new oncologist since his is leaving he reports.   Also referred to pain management per his request.      Smoker - advised cessation.  We spent 3 minutes counseling him on smoking cessation strategies including nicotine replacement and Chantix.  He says that is too expensive.      Patient understands our medical plan. Patient has provided input and agrees with goals.  Alternatives have been explained and offered.  Risks/benefits and significant side effects of medications have been reviewed.  Patient encouraged to review package inserts for any medications. All questions answered.  The patient is to call if condition worsens or fails to improve.     Follow-up Disposition:  Return in about 3 days (around 12/20/2014) for blood pressure.  Bring all medication bottles to appointment.

## 2014-12-17 NOTE — Progress Notes (Signed)
Chief Complaint   Patient presents with   ??? Other     prediabetes   ??? Hypertension   ??? Cholesterol Problem   ??? Results     review     Patient admits that he has not been taking Lipitor as prescribed.    Any recent (exertional) chest pain? Patient denie  SOB? Patient denies  Palpitations?Patient denies  LE edema?Patient denies  Lightheadedness/dizziness? Patient denies    TDAP: per patient he has had one within the last 10 years but does not recall when or where  Eye exam: not up to date  Hep C screening: has not been screened    1. Have you been to the ER, urgent care clinic since your last visit?  Hospitalized since your last visit? No    2. Have you seen or consulted any other health care providers outside of the [facility] since your last visit?  Include any pap smears or colon screening. Yes, Dr. Arnette FeltsFranzman    Patient declines flu vaccine.

## 2014-12-17 NOTE — Patient Instructions (Signed)
DASH Diet: Care Instructions  Your Care Instructions  The DASH diet is an eating plan that can help lower your blood pressure. DASH stands for Dietary Approaches to Stop Hypertension. Hypertension is high blood pressure.  The DASH diet focuses on eating foods that are high in calcium, potassium, and magnesium. These nutrients can lower blood pressure. The foods that are highest in these nutrients are fruits, vegetables, low-fat dairy products, nuts, seeds, and legumes. But taking calcium, potassium, and magnesium supplements instead of eating foods that are high in those nutrients does not have the same effect. The DASH diet also includes whole grains, fish, and poultry.  The DASH diet is one of several lifestyle changes your doctor may recommend to lower your high blood pressure. Your doctor may also want you to decrease the amount of sodium in your diet. Lowering sodium while following the DASH diet can lower blood pressure even further than just the DASH diet alone.  Follow-up care is a key part of your treatment and safety. Be sure to make and go to all appointments, and call your doctor if you are having problems. It's also a good idea to know your test results and keep a list of the medicines you take.  How can you care for yourself at home?  Following the DASH diet  ?? Eat 4 to 5 servings of fruit each day. A serving is 1 medium-sized piece of fruit, ?? cup chopped or canned fruit, 1/4 cup dried fruit, or 4 ounces (?? cup) of fruit juice. Choose fruit more often than fruit juice.  ?? Eat 4 to 5 servings of vegetables each day. A serving is 1 cup of lettuce or raw leafy vegetables, ?? cup of chopped or cooked vegetables, or 4 ounces (?? cup) of vegetable juice. Choose vegetables more often than vegetable juice.  ?? Get 2 to 3 servings of low-fat and fat-free dairy each day. A serving is 8 ounces of milk, 1 cup of yogurt, or 1 ?? ounces of cheese.   ?? Eat 6 to 8 servings of grains each day. A serving is 1 slice of bread, 1 ounce of dry cereal, or ?? cup of cooked rice, pasta, or cooked cereal. Try to choose whole-grain products as much as possible.  ?? Limit lean meat, poultry, and fish to 2 servings each day. A serving is 3 ounces, about the size of a deck of cards.  ?? Eat 4 to 5 servings of nuts, seeds, and legumes (cooked dried beans, lentils, and split peas) each week. A serving is 1/3 cup of nuts, 2 tablespoons of seeds, or ?? cup of cooked beans or peas.  ?? Limit fats and oils to 2 to 3 servings each day. A serving is 1 teaspoon of vegetable oil or 2 tablespoons of salad dressing.  ?? Limit sweets and added sugars to 5 servings or less a week. A serving is 1 tablespoon jelly or jam, ?? cup sorbet, or 1 cup of lemonade.  ?? Eat less than 2,300 milligrams (mg) of sodium a day. If you limit your sodium to 1,500 mg a day, you can lower your blood pressure even more.  Tips for success  ?? Start small. Do not try to make dramatic changes to your diet all at once. You might feel that you are missing out on your favorite foods and then be more likely to not follow the plan. Make small changes, and stick with them. Once those changes become habit, add a few more   changes.  ?? Try some of the following:  ?? Make it a goal to eat a fruit or vegetable at every meal and at snacks. This will make it easy to get the recommended amount of fruits and vegetables each day.  ?? Try yogurt topped with fruit and nuts for a snack or healthy dessert.  ?? Add lettuce, tomato, cucumber, and onion to sandwiches.  ?? Combine a ready-made pizza crust with low-fat mozzarella cheese and lots of vegetable toppings. Try using tomatoes, squash, spinach, broccoli, carrots, cauliflower, and onions.  ?? Have a variety of cut-up vegetables with a low-fat dip as an appetizer instead of chips and dip.  ?? Sprinkle sunflower seeds or chopped almonds over salads. Or try adding  chopped walnuts or almonds to cooked vegetables.  ?? Try some vegetarian meals using beans and peas. Add garbanzo or kidney beans to salads. Make burritos and tacos with mashed pinto beans or black beans.  Where can you learn more?  Go to http://www.healthwise.net/GoodHelp[employer]ctions  Enter H967 in the search box to learn more about "DASH Diet: Care Instructions."  ?? 2006-2016 [employer]. Care instructions adapted under license by [employer] (which disclaims liability or warranty for this information). This care instruction is for use with your licensed healthcare professional. If you have questions about a medical condition or this instruction, always ask your healthcare professional. Healthwise, Incorporated disclaims any warranty or liability for your use of this information.  Content Version: 11.0.578772; Current as of: May 09, 2014

## 2014-12-17 NOTE — Addendum Note (Signed)
Addended by: Wonda OldsNEWSOME, JENNIFER C on: 12/17/2014 10:50 AM      Modules accepted: Level of Service

## 2014-12-20 ENCOUNTER — Encounter: Attending: Family Medicine

## 2014-12-20 NOTE — Telephone Encounter (Signed)
Received incoming call  from Clearance CootsMichael Yi sister Alvira Philips([name]) in reference to his clearance form for dental procedure being done today at [facility] in [place]. She was a little upset that her brother has not been cleared for an elective surgery due to he no-showed his appointment on 10-03-2014 with [name] and was seen on 12-17-2014 by [name]. I explain to her the best to my ability that on Monday 12-17-2014 visit Kerri's blood pressure was elevated and for that reason he was to return to [facility] in 3 days (which was today 12-20-2014 @ 9:30 am). Alvira PhilipsGeraldine stated (with voice raised) that he did not have an appointment today and that was an error on our ([facility]) part and if they would have known this they wouldn't have made a trip to [place] for the procedure. I explain to her that this [facility] [name] on 12-17-2014. Form is being faxed over for Dr. Shelda AltesKapur's review.

## 2014-12-20 NOTE — Telephone Encounter (Signed)
Place call back to Alvira PhilipsGeraldine to inform her that form has not ben received.via fax. Her brother Casimiro NeedleMichael answer her phone and stated that his procedure has been rescheduled and that he personally has the form in his hand and with bring it to his appointment with [name] on  12-24-2014.

## 2014-12-24 ENCOUNTER — Ambulatory Visit: Admit: 2014-12-24 | Discharge: 2014-12-24 | Payer: MEDICARE | Attending: Family Medicine

## 2014-12-24 DIAGNOSIS — I10 Essential (primary) hypertension: Secondary | ICD-10-CM

## 2014-12-24 MED ORDER — CLONIDINE 0.1 MG TAB
0.1 mg | ORAL_TABLET | Freq: Two times a day (BID) | ORAL | 0 refills | Status: DC
Start: 2014-12-24 — End: 2014-12-31

## 2014-12-24 NOTE — Progress Notes (Signed)
SUBJECTIVE  Chief Complaint   Patient presents with   ??? Hypertension     elevated blood pressure / follow up from 12-17-2014   ??? Pre-op Exam     f/u 01/15/15 with [facility]     Chronically medically noncomplaint patient here for follow-up.      Patient presents for follow-up on his hypertension. He says he is taking atenolol (at the new dose) and Lotrel.   BP is elevated but improved.  He was on clonidine in the past which worked well.  He denies any side effects with the medications.  The patient denies any chest pain or chest tightness.  Denies any dyspnea upon exertion.  The patient denies any blurred vision or double vision.      OBJECTIVE    Blood pressure (!) 148/96, pulse 74, temperature 98.2 ??F (36.8 ??C), temperature source Oral, resp. rate 16, height 6\' 5"  (1.956 m), weight 340 lb (154.2 kg), SpO2 98 %.  General:  Alert, cooperative, well appearing, in no apparent distress.  Vasc:  No swelling.  No carotid bruits.  No JVD.  CV:  The heart sounds are regular in rate and rhythm.  There is a normal S1 and S2.  There or no murmurs.  Lungs:  Inspiratory and expiratory efforts are full and unlabored.  Lung sounds are clear and equal to auscultation throughout all lung fields without wheezing, rales, or rhonchi.  Psych: normal affect.  Mood good.  Oriented x 3.  Judgement and insight intact.     ASSESSMENT / PLAN    ICD-10-CM ICD-9-CM    1. Essential hypertension I10 401.9      HTN - Uncontrolled.  Continue current care and add clonidine 0.1mg  po bid.   I cannot clear him for his dental procedure until his BP is improved more.     Patient understands our medical plan. Patient has provided input and agrees with goals.  Alternatives have been explained and offered.  Risks/benefits and significant side effects of medications have been reviewed.  Patient encouraged to review package inserts for any medications. All questions answered.  The patient is to call if condition worsens or fails to improve.      Follow-up Disposition:  Return in about 1 week (around 12/31/2014) for blood pressure recheck.

## 2014-12-24 NOTE — Progress Notes (Signed)
1. Have you been to the ER, urgent care clinic since your last visit?  Hospitalized since your last visit?No    2. Have you seen or consulted any other health care providers outside of the [facility] since your last visit?  Include any pap smears or colon screening. No

## 2014-12-24 NOTE — Patient Instructions (Addendum)
[facility]   [address]  [place], [place] [id number]  [phone]    Follow up in 1 week for blood pressure check

## 2014-12-24 NOTE — Progress Notes (Signed)
Chief Complaint   Patient presents with   ??? Hypertension     elevated blood pressure / follow up from 12-17-2014   ??? Pre-op Exam

## 2014-12-27 NOTE — Telephone Encounter (Signed)
An appointment has been made for patient to see:    Doctor: Dr. Louanna RawSnehal Damle     Specialty: Hem/Onc    Address/telephone #: [address] [place], [place] [id number] [phone]    Appointment date/time: 01/07/15 @ 1:15 pm arrive by 12:45 pm    Diagnosis: DVT    Patient notified of appointment information.

## 2014-12-31 ENCOUNTER — Ambulatory Visit: Admit: 2014-12-31 | Discharge: 2014-12-31 | Payer: MEDICARE | Attending: Family Medicine

## 2014-12-31 ENCOUNTER — Emergency Department: Admit: 2014-12-31 | Payer: MEDICARE

## 2014-12-31 ENCOUNTER — Inpatient Hospital Stay
Admit: 2014-12-31 | Discharge: 2014-12-31 | Disposition: A | Discharge status: Home / Self Care | Payer: MEDICARE | Attending: Emergency Medicine

## 2014-12-31 DIAGNOSIS — I10 Essential (primary) hypertension: Secondary | ICD-10-CM

## 2014-12-31 DIAGNOSIS — M545 Low back pain: Secondary | ICD-10-CM

## 2014-12-31 MED ORDER — NAPROXEN 500 MG TAB
500 mg | ORAL_TABLET | Freq: Two times a day (BID) | ORAL | 0 refills | Status: DC
Start: 2014-12-31 — End: 2015-05-30

## 2014-12-31 MED ORDER — CYCLOBENZAPRINE 5 MG TAB
5 mg | ORAL_TABLET | Freq: Every evening | ORAL | 0 refills | Status: DC
Start: 2014-12-31 — End: 2015-05-30

## 2014-12-31 MED ORDER — CLONIDINE 0.1 MG TAB
0.1 mg | ORAL_TABLET | Freq: Two times a day (BID) | ORAL | 3 refills | Status: DC
Start: 2014-12-31 — End: 2015-05-30

## 2014-12-31 NOTE — Progress Notes (Signed)
SUBJECTIVE  Chief Complaint   Patient presents with   ??? Hypertension     Chronically medically noncomplaint patient here for follow-up.   Still did not bring in medications as requested.    Patient presents for follow-up on his hypertension. He says he is taking all of his meds as prescribed as well as the newly prescribed clonidine.   BP is only minimally elevated but improved drastically from where we started a few weeks ago.  He denies any side effects with the medications.  The patient denies any chest pain or chest tightness.  Denies any dyspnea upon exertion.  The patient denies any blurred vision or double vision.    OBJECTIVE    Blood pressure 134/86, pulse 79, temperature 98.2 ??F (36.8 ??C), temperature source Oral, resp. rate 16, height 6\' 5"  (1.956 m), weight 340 lb (154.2 kg), SpO2 97 %.  General:  Alert, cooperative, well appearing, in no apparent distress.  Vasc:  No swelling.  No carotid bruits.  No JVD.  CV:  The heart sounds are regular in rate and rhythm.  There is a normal S1 and S2.  There or no murmurs.  Lungs:  Inspiratory and expiratory efforts are full and unlabored.  Lung sounds are clear and equal to auscultation throughout all lung fields without wheezing, rales, or rhonchi.  Psych: normal affect.  Mood good.  Oriented x 3.  Judgement and insight intact.     ASSESSMENT / PLAN    ICD-10-CM ICD-9-CM    1. Essential hypertension I10 401.9      HTN - Continue current care.   At this time I have cleared him for his dental procedure on 01/15/2015.   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     10 minutes of face to face time spent with the patient with at least 50% on counseling on strategies to maintain normal BP.    Patient understands our medical plan. Patient has provided input and agrees with goals.  Alternatives have been explained and offered.  Risks/benefits and significant side effects of medications have been  reviewed.  Patient encouraged to review package inserts for any medications. All questions answered.  The patient is to call if condition worsens or fails to improve.     Follow-up Disposition:  Return in about 3 months (around 04/02/2015) for routine care and Wellness exam.

## 2014-12-31 NOTE — ED Notes (Signed)
Patient c/o pain to his left leg, this morning patient fell the on the stairs and hurt his lower back. Rates pain 10.

## 2014-12-31 NOTE — Patient Instructions (Signed)
DASH Diet: Care Instructions  Your Care Instructions  The DASH diet is an eating plan that can help lower your blood pressure. DASH stands for Dietary Approaches to Stop Hypertension. Hypertension is high blood pressure.  The DASH diet focuses on eating foods that are high in calcium, potassium, and magnesium. These nutrients can lower blood pressure. The foods that are highest in these nutrients are fruits, vegetables, low-fat dairy products, nuts, seeds, and legumes. But taking calcium, potassium, and magnesium supplements instead of eating foods that are high in those nutrients does not have the same effect. The DASH diet also includes whole grains, fish, and poultry.  The DASH diet is one of several lifestyle changes your doctor may recommend to lower your high blood pressure. Your doctor may also want you to decrease the amount of sodium in your diet. Lowering sodium while following the DASH diet can lower blood pressure even further than just the DASH diet alone.  Follow-up care is a key part of your treatment and safety. Be sure to make and go to all appointments, and call your doctor if you are having problems. It's also a good idea to know your test results and keep a list of the medicines you take.  How can you care for yourself at home?  Following the DASH diet  ?? Eat 4 to 5 servings of fruit each day. A serving is 1 medium-sized piece of fruit, ?? cup chopped or canned fruit, 1/4 cup dried fruit, or 4 ounces (?? cup) of fruit juice. Choose fruit more often than fruit juice.  ?? Eat 4 to 5 servings of vegetables each day. A serving is 1 cup of lettuce or raw leafy vegetables, ?? cup of chopped or cooked vegetables, or 4 ounces (?? cup) of vegetable juice. Choose vegetables more often than vegetable juice.  ?? Get 2 to 3 servings of low-fat and fat-free dairy each day. A serving is 8 ounces of milk, 1 cup of yogurt, or 1 ?? ounces of cheese.   ?? Eat 6 to 8 servings of grains each day. A serving is 1 slice of bread, 1 ounce of dry cereal, or ?? cup of cooked rice, pasta, or cooked cereal. Try to choose whole-grain products as much as possible.  ?? Limit lean meat, poultry, and fish to 2 servings each day. A serving is 3 ounces, about the size of a deck of cards.  ?? Eat 4 to 5 servings of nuts, seeds, and legumes (cooked dried beans, lentils, and split peas) each week. A serving is 1/3 cup of nuts, 2 tablespoons of seeds, or ?? cup of cooked beans or peas.  ?? Limit fats and oils to 2 to 3 servings each day. A serving is 1 teaspoon of vegetable oil or 2 tablespoons of salad dressing.  ?? Limit sweets and added sugars to 5 servings or less a week. A serving is 1 tablespoon jelly or jam, ?? cup sorbet, or 1 cup of lemonade.  ?? Eat less than 2,300 milligrams (mg) of sodium a day. If you limit your sodium to 1,500 mg a day, you can lower your blood pressure even more.  Tips for success  ?? Start small. Do not try to make dramatic changes to your diet all at once. You might feel that you are missing out on your favorite foods and then be more likely to not follow the plan. Make small changes, and stick with them. Once those changes become habit, add a few more   changes.  ?? Try some of the following:  ?? Make it a goal to eat a fruit or vegetable at every meal and at snacks. This will make it easy to get the recommended amount of fruits and vegetables each day.  ?? Try yogurt topped with fruit and nuts for a snack or healthy dessert.  ?? Add lettuce, tomato, cucumber, and onion to sandwiches.  ?? Combine a ready-made pizza crust with low-fat mozzarella cheese and lots of vegetable toppings. Try using tomatoes, squash, spinach, broccoli, carrots, cauliflower, and onions.  ?? Have a variety of cut-up vegetables with a low-fat dip as an appetizer instead of chips and dip.  ?? Sprinkle sunflower seeds or chopped almonds over salads. Or try adding  chopped walnuts or almonds to cooked vegetables.  ?? Try some vegetarian meals using beans and peas. Add garbanzo or kidney beans to salads. Make burritos and tacos with mashed pinto beans or black beans.  Where can you learn more?  Go to http://www.healthwise.net/GoodHelp[employer]ctions  Enter H967 in the search box to learn more about "DASH Diet: Care Instructions."  ?? 2006-2016 [employer]. Care instructions adapted under license by [employer] (which disclaims liability or warranty for this information). This care instruction is for use with your licensed healthcare professional. If you have questions about a medical condition or this instruction, always ask your healthcare professional. Healthwise, Incorporated disclaims any warranty or liability for your use of this information.  Content Version: 11.0.578772; Current as of: May 09, 2014

## 2014-12-31 NOTE — Progress Notes (Signed)
Chief Complaint   Patient presents with   ??? Hypertension     1. Have you been to the ER, urgent care clinic since your last visit?  Hospitalized since your last visit? No    2. Have you seen or consulted any other health care providers outside of the [facility] since your last visit?  Include any pap smears or colon screening. No

## 2014-12-31 NOTE — ED Triage Notes (Signed)
Patient c/o pain to left leg and lower back rates pain 10. Patient also fell this morning.

## 2014-12-31 NOTE — ED Provider Notes (Signed)
HPI Comments: Sean AlmMichael A [name] is a 54 y.o. male that presents to the ED with a 3 day history of left knee pain.  Patient states that the pain has been present for a few days but today caused him to fall while coming down a set of stairs today.  Patient also complains of back pain following the fall.  Patients medical history is significant for multiple thromboembolis, DM and venous insufficiency.  Patient currently taking fondaparinux as prophylaxis and has IVC filter placed.  Patient was at PCP this morning who sent him here for work up.    Patient is a 54 y.o. male presenting with leg pain and back pain.   Leg Pain    Associated symptoms include back pain.   Back Pain    Associated symptoms include leg pain.        Past Medical History:   Diagnosis Date   ??? Cyst of left kidney      referred to urology   ??? DVT (deep venous thrombosis) (HCC)    ??? GERD (gastroesophageal reflux disease)    ??? 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 H/O esophagogastroduodenoscopy 11/16/11     Dr. Eulogio Ditchamle; normal stomach and duodenum; hiatal hernia in the cardia   ??? High cholesterol    ??? [facility]   ??? Hypertension    ??? Prediabetes    ??? S/P colonoscopy with polypectomy 11/16/11     Dr. Eulogio Ditchamle; polyps x 3; otherwise normal colonoscopy; f/u with gastro in 1 year   ??? Thromboembolus (HCC)      last one 5 to 6 months ago   ??? Unspecified sleep apnea      no CPAP       Past Surgical History:   Procedure Laterality Date   ??? Pr abdomen surgery proc unlisted       ulcer in stomach removed   ??? Hx other surgical  3 years ago     Port A Cath, inserted and removed         Family History:   Problem Relation Age of Onset   ??? Heart Attack Mother    ??? Hypertension Mother    ??? Arthritis-rheumatoid Mother    ??? Heart Failure Mother    ??? High Cholesterol Mother    ??? Hypertension Father    ??? Alcohol abuse Father    ??? Other Father      Tobacco Use       Social History     Social History    ??? Marital status: SINGLE     Spouse name: N/A   ??? Number of children: N/A   ??? Years of education: N/A     Occupational History   ??? disability      Social History Main Topics   ??? Smoking status: Current Every Day Smoker     Packs/day: 0.80     Years: 15.00     Types: Cigarettes   ??? Smokeless tobacco: Never Used   ??? Alcohol use No   ??? Drug use: No   ??? Sexual activity: Yes     Partners: Female     Other Topics Concern   ??? Not on file     Social History Narrative         ALLERGIES: Review of patient's allergies indicates no known allergies.    Review of Systems   Constitutional: Negative.  HENT: Negative.    Eyes: Negative.    Respiratory: Negative.    Cardiovascular: Negative.    Gastrointestinal: Negative.  Negative for abdominal distention.   Endocrine: Negative.    Genitourinary: Negative.    Musculoskeletal: Positive for arthralgias and back pain.   Skin: Negative.    Allergic/Immunologic: Negative.    Neurological: Negative.    Hematological: Negative.    Psychiatric/Behavioral: Negative.        Vitals:    12/31/14 1046   BP: 142/87   Pulse: 70   Resp: 16   Temp: 98.2 ??F (36.8 ??C)   SpO2: 97%   Weight: 154.2 kg (340 lb)   Height:  (1.956 m)            Physical Exam   Constitutional: He is oriented to person, place, and time. He appears well-developed and well-nourished. No distress.   HENT:   Head: Normocephalic and atraumatic.   Eyes: Conjunctivae are normal. Pupils are equal, round, and reactive to light.   Neck: Normal range of motion.   Cardiovascular: Normal rate, regular rhythm and normal heart sounds.    Pulmonary/Chest: Effort normal and breath sounds normal.   Musculoskeletal:        Left knee: He exhibits decreased range of motion. He exhibits no swelling, no effusion, no ecchymosis, no deformity, no laceration, no erythema, normal alignment, no LCL laxity, normal patellar mobility, no bony tenderness, normal meniscus and no MCL laxity. Tenderness found. Patellar tendon tenderness noted.         Lumbar back: He exhibits decreased range of motion, tenderness and spasm. He exhibits no bony tenderness, no swelling, no edema, no deformity, no laceration, no pain and normal pulse.   Neurological: He is alert and oriented to person, place, and time.   Skin: Skin is warm and dry.   Psychiatric: He has a normal mood and affect. His behavior is normal. Thought content normal.   Nursing note and vitals reviewed.       MDM  Number of Diagnoses or Management Options  Diagnosis management comments: L knee xray: no acute process as read by self    Impression: Knee pain, lumbago    Plan:  Discharge home  RICE  Anti inflammatory and muscle relaxer medicaitons  Follow up with PCP       Amount and/or Complexity of Data Reviewed  Tests in the radiology section of CPT??: ordered and reviewed      ED Course       Procedures

## 2014-12-31 NOTE — ED Notes (Signed)
Current Discharge Medication List      START taking these medications    Details   cyclobenzaprine (FLEXERIL) 5 mg tablet Take 1 Tab by mouth nightly.  Qty: 7 Tab, Refills: 0      naproxen (NAPROSYN) 500 mg tablet Take 1 Tab by mouth two (2) times daily (with meals).  Qty: 20 Tab, Refills: 0         CONTINUE these medications which have NOT CHANGED    Details   cloNIDine HCl (CATAPRES) 0.1 mg tablet Take 1 Tab by mouth two (2) times a day.  Qty: 60 Tab, Refills: 3      atenolol (TENORMIN) 100 mg tablet Take 1 Tab by mouth daily.  Qty: 30 Tab, Refills: 0    Associated Diagnoses: Essential hypertension      amLODIPine-benazepril (LOTREL) 10-40 mg per capsule Take 1 Cap by mouth daily.  Qty: 30 Cap, Refills: 0    Associated Diagnoses: Essential hypertension      atorvastatin (LIPITOR) 40 mg tablet Take 1 Tab by mouth daily.  Qty: 30 Tab, Refills: 0    Associated Diagnoses: Hypercholesterolemia      fondaparinux (ARIXTRA) 10 mg/0.8 mL syrg 7.5 mg by SubCUTAneous route two (2) times a day. Indications: DEEP VEIN THROMBOSIS PREVENTION    Associated Diagnoses: Prediabetes      ergocalciferol (VITAMIN D2) 50,000 unit capsule Take 50,000 Units by mouth every Monday and Friday.      glucose blood VI test strips (ASCENSIA AUTODISC VI, ONE TOUCH ULTRA TEST VI) strip Check fasting and 1 hour after eating as needed  Qty: 1 Package, Refills: 11    Associated Diagnoses: Diabetes (HCC)      Blood-Glucose Meter monitoring kit Use as directed  Qty: 1 kit, Refills: 0    Associated Diagnoses: Diabetes ([place])      Lancets misc Use as directed  Qty: 1 Package, Refills: 11    Associated Diagnoses: Diabetes ([place])      zolpidem (AMBIEN) 10 mg tablet Take 10 mg by mouth nightly as needed.      omeprazole (PRILOSEC) 20 mg capsule Take 20 mg by mouth daily.             Patient armband removed and shredded  Prescriptions given and reviewed with patient

## 2015-01-01 NOTE — Progress Notes (Signed)
NNTOCED    NN Transitions of Care Note:  Emergency Department Follow Up for [facility] Visit from 01/01/15 - 01/01/15 for Acute right-sided low back pain without sciatica.      Total Hospitalizations/ED visits last 12 months?   IP - 0; ED - 2    Patient had a routine follow up appointment with Dr. Maryruth Bun on yesterday 12/31/14.  Per EMR, he presented at [facility] after his appointment at the request of Dr. Maryruth Bun.  Next routine follow up appointment is as below.  Future Appointments  Date Time Provider Department Center   04/02/2015 9:00 AM Amil Amen, MD [facility]       Called patient on 01/01/15 and verified with 2 identifiers.   Patient states back pain improved with medications.  States he is feeling better.  Encouraged him to contact [facility] for appointment if he needs to be seen again.  He is in agreement.     Medication Reconciliation completed: no, unable to complete at this time   New medications at discharge include Flexeril and Naprosyn     Support System consists of: sister, family    Barriers tocare?  No.  Patient attends appointments as scheduled.    Adherence to previous treatment and likelihood for f/u:  Yes     Future Plan for Patient to include communication plan:   1.  Nurse navigator will follow during 30 day post ED TOC episode.  2.  Nurse navigator provided contact information to patient/caregiver for future calls as needed.  3.  Patient to schedule appointment if pain continues.    Patient/caregiver provided input to plan and agrees with plan and goals.      ED Visit Details    Patient presenting symptoms:   Leg pain and back pain    Diagnosed with Acute right-sided low back pain without sciatica; Acute pain of left knee; Elevated blood pressure.      Medical History:     Past Medical History   Diagnosis Date   ??? Cyst of left kidney      referred to urology   ??? DVT (deep venous thrombosis) (HCC)    ??? GERD (gastroesophageal reflux disease)    ??? Greenfield filter in place     ??? H/O esophagogastroduodenoscopy 11/16/11     Dr. Eulogio Ditch; normal stomach and duodenum; hiatal hernia in the cardia   ??? High cholesterol    ??? History of stress test 06/2012     [facility] - negative stress ECHO   ??? Hypertension    ??? Prediabetes    ??? S/P colonoscopy with polypectomy 11/16/11     Dr. Eulogio Ditch; polyps x 3; otherwise normal colonoscopy; f/u with gastro in 1 year   ??? Thromboembolus (HCC)      last one 5 to 6 months ago   ??? Unspecified sleep apnea      no CPAP        ED interventions (referenced by ED Provider Note by Winn Jock, PA, below in italics):   ED Provider Notes Authored by Winn Jock, PA   ED Provider Notes by Winn Jock, PA at 12/31/14 1325   Author: Winn Jock, PA Service: EMERGENCY Author Type: Physician Assistant   Filed: 12/31/14 1337 Date of Service: 12/31/14 1325 Status: Attested   Editor: Winn Jock, PA (Physician Assistant) Cosigner: Reuel Derby, MD at 12/31/14 1349   Attestation signed by Reuel Derby, MD at 12/31/14 1349   I was  personally available for consultation in the emergency department. I have reviewed the chart prior to the patient's discharge??and agree with the documentation recorded by the [facility], including the assessment, treatment plan, and disposition.??        HPI Comments: Julio AlmMichael A Buddenhagen is a 54 y.o. male that presents to the ED with a 3 day history of left knee pain. Patient states that the pain has been present for a few days but today caused him to fall while coming down a set of stairs today. Patient also complains of back pain following the fall. Patients medical history is significant for multiple thromboembolis, DM and venous insufficiency. Patient currently taking fondaparinux as prophylaxis and has IVC filter placed. Patient was at PCP this morning who sent him here for work up.  ??  Patient is a 54 y.o. male presenting with leg pain and back pain.   Leg Pain    Associated symptoms include back pain.   Back Pain     Associated symptoms include leg pain.      ??   Past??Medical??History          Past Medical History:   Diagnosis Date   ??? Cyst of left kidney ??   ?? ?? referred to urology   ??? DVT (deep venous thrombosis) (HCC) ??   ??? GERD (gastroesophageal reflux disease) ??   ??? Greenfield filter in place ??   ??? H/O esophagogastroduodenoscopy 11/16/11   ?? ?? Dr. Eulogio Ditchamle; normal stomach and duodenum; hiatal hernia in the cardia   ??? High cholesterol ??   ??? History of stress [facility]2012   [facility]   ??? Hypertension ??   ??? Prediabetes ??   ??? S/P colonoscopy with polypectomy 11/16/11   ?? ?? Dr. Eulogio Ditchamle; polyps x 3; otherwise normal colonoscopy; f/u with gastro in 1 year   ??? Thromboembolus (HCC) ??   ?? ?? last one 5 to 6 months ago   ??? Unspecified sleep apnea ??   ?? ?? no CPAP      ??  ??   Past??Surgical??History           Past Surgical History:   Procedure Laterality Date   ??? Pr abdomen surgery proc unlisted ?? ??   ?? ?? ulcer in stomach removed   ??? Hx other surgical ?? 3 years ago   ?? ?? Port A Cath, inserted and removed      ??  ????         Family History:   Problem Relation Age of Onset   ??? Heart Attack Mother ??   ??? Hypertension Mother ??   ??? Arthritis-rheumatoid Mother ??   ??? Heart Failure Mother ??   ??? High Cholesterol Mother ??   ??? Hypertension Father ??   ??? Alcohol abuse Father ??   ??? Other Father ??   ?? ?? Tobacco Use   ??  ??  Social History   ??        Social History   ??? Marital status: SINGLE   ?? ?? Spouse name: N/A   ??? Number of children: N/A   ??? Years of education: N/A   ??       Occupational History   ??? disability ??   ??        Social History Main Topics   ??? Smoking status: Current Every Day Smoker   ?? ?? Packs/day: 0.80   ?? ?? Years: 15.00   ?? ??  Types: Cigarettes   ??? Smokeless tobacco: Never Used   ??? Alcohol use No   ??? Drug use: No   ??? Sexual activity: Yes   ?? ?? Partners: Female   ??       Other Topics Concern   ??? Not on file   ??  Social History Narrative   ??????  ALLERGIES: Review of patient's allergies indicates no known allergies.  ??   Review of Systems   Constitutional: Negative.   HENT: Negative.   Eyes: Negative.   Respiratory: Negative.   Cardiovascular: Negative.   Gastrointestinal: Negative. Negative for abdominal distention.   Endocrine: Negative.   Genitourinary: Negative.   Musculoskeletal: Positive for arthralgias and back pain.   Skin: Negative.   Allergic/Immunologic: Negative.   Neurological: Negative.   Hematological: Negative.   Psychiatric/Behavioral: Negative.   ??  ??      Vitals:   ?? 12/31/14 1046   BP: 142/87   Pulse: 70   Resp: 16   Temp: 98.2 ??F (36.8 ??C)   SpO2: 97%   Weight: 154.2 kg (340 lb)   Height:  (1.956 m)    ??  Physical Exam   Constitutional: He is oriented to person, place, and time. He appears well-developed and well-nourished. No distress.   HENT:   Head: Normocephalic and atraumatic.   Eyes: Conjunctivae are normal. Pupils are equal, round, and reactive to light.       Neck: Normal range of motion.   Cardiovascular: Normal rate, regular rhythm and normal heart sounds.   Pulmonary/Chest: Effort normal and breath sounds normal.   Musculoskeletal:   Left knee: He exhibits decreased range of motion. He exhibits no swelling, no effusion, no ecchymosis, no deformity, no laceration, no erythema, normal alignment, no LCL laxity, normal patellar mobility, no bony tenderness, normal meniscus and no MCL laxity. Tenderness found. Patellar tendon tenderness noted.   Lumbar back: He exhibits decreased range of motion, tenderness and spasm. He exhibits no bony tenderness, no swelling, no edema, no deformity, no laceration, no pain and normal pulse.   Neurological: He is alert and oriented to person, place, and time.   Skin: Skin is warm and dry.   Psychiatric: He has a normal mood and affect. His behavior is normal. Thought content normal.   Nursing note and vitals reviewed.     ??  MDM  Number of Diagnoses or Management Options  Diagnosis management comments: L knee xray: no acute process as read by self  ??   Impression: Knee pain, lumbago  ??  Plan:  Discharge home  RICE  Anti inflammatory and muscle relaxer medicaitons  Follow up with PCP  ??  ????  Amount and/or Complexity of Data Reviewed  Tests in the radiology section of CPT??: ordered and reviewed  ??  ED Course   ??  ??  Procedures  ??  ????????      Authored by Winn Jock, PA on 12/31/14 1349                 Elwin Mocha Jacqualynn Parco, MSN, RN, ACNS-BC, CCM  Statistician, [employer] [phone]

## 2015-01-01 NOTE — Progress Notes (Signed)
Goals completed.  Navigation type closed.  Post ED episode resolved from 08/13/14.

## 2015-04-02 ENCOUNTER — Encounter: Attending: Family Medicine

## 2015-05-20 ENCOUNTER — Encounter

## 2015-05-20 NOTE — Telephone Encounter (Signed)
Called [phone] no answer, no voicemail.  Spoke with patient [phone].  He has been scheduled for 05/30/15.

## 2015-05-20 NOTE — Telephone Encounter (Signed)
No-show Feb appt.  Chronically noncompliant.

## 2015-05-29 NOTE — Addendum Note (Signed)
Addended by: Edwena BundeMOORE, Franki Stemen P on: 05/29/2015 09:31 AM      Modules accepted: Orders

## 2015-05-30 ENCOUNTER — Ambulatory Visit: Admit: 2015-05-30 | Discharge: 2015-05-30 | Payer: MEDICARE | Attending: Family Medicine

## 2015-05-30 DIAGNOSIS — I10 Essential (primary) hypertension: Secondary | ICD-10-CM

## 2015-05-30 DIAGNOSIS — Z0000 Encounter for general adult medical examination without abnormal findings: Secondary | ICD-10-CM

## 2015-05-30 MED ORDER — AMLODIPINE-BENAZEPRIL 10 MG-40 MG CAP
10-40 mg | ORAL_CAPSULE | Freq: Every day | ORAL | 3 refills | Status: DC
Start: 2015-05-30 — End: 2015-10-10

## 2015-05-30 MED ORDER — ATENOLOL 100 MG TAB
100 mg | ORAL_TABLET | Freq: Every day | ORAL | 3 refills | Status: DC
Start: 2015-05-30 — End: 2015-10-10

## 2015-05-30 MED ORDER — ATORVASTATIN 40 MG TAB
40 mg | ORAL_TABLET | Freq: Every day | ORAL | 3 refills | Status: DC
Start: 2015-05-30 — End: 2015-10-10

## 2015-05-30 NOTE — Patient Instructions (Addendum)
Medicare Wellness Visit, Male    The best way to live healthy is to have a healthy lifestyle by eating a well-balanced diet, exercising regularly, limiting alcohol and stopping smoking.    Regular physical exams and screening tests are another way to keep healthy.   Preventive exams provided by your health care provider can find health problems before they become diseases or illnesses. Preventive services including immunizations, screening tests, monitoring and exams can help you take care of your own health.    All people over age 55 should have a pneumovax  and and a prevnar shot to prevent pneumonia. These are once in a lifetime unless you and your provider decide differently.    All people over 65 should have a yearly flu shot and a tetanus vaccine every 10 years.    Screening for diabetes mellitus with a blood sugar test should be done every year.    Glaucoma is a disease of the eye due to increased ocular pressure that can lead to blindness and it should be done every year by an eye professional.    Cardiovascular screening tests that check for elevated lipids (fatty part of blood) which can lead to heart disease and strokes should be done every 5 years.    Colorectal screening that evaluates for blood or polyps in your colon should be done yearly as a stool test or every five years as a flexible sigmoidoscope or every 10 years as a colonoscopy up to age 575.    Men up to age 68 may need a screening blood test for prostate cancer at certain intervals, depending on their personal and family history. This decision is between the patient and his provider.    If you have been a smoker or had family history of abdominal aortic aneurysms, you and your provider may decide to schedule an ultrasound test of your aorta.    Hepatitis C screening is also recommended for anyone born between [id number] through 1965.    A shingles vaccine is also recommended once in a lifetime after age 55.     Your Medicare Wellness Exam is recommended annually.    Here is a list of your current Health Maintenance items with a due date:  Health Maintenance Due   Topic Date Due   ??? Eye Exam  11/09/2013   ??? Albumin Urine Test  03/16/2014   ??? Glaucoma Screening   11/10/2014   ??? Diabetic Foot Care  04/05/2015   ??? Hemoglobin A1C    06/13/2015     Follow up in 1 year for annual medicare wellness

## 2015-05-30 NOTE — ACP (Advance Care Planning) (Signed)
Advance Care Planning    Advance Care Planning (ACP) Provider Note - Comprehensive     Date of ACP Conversation: 05/30/15  Persons included in Conversation:  patient  Length of ACP Conversation in minutes:  16 minutes    Authorized Management consultantDecision Maker (if patient is incapable of making informed decisions):   This person is:  not identified but he lives with his sister currently          General ACP for ALL Patients with Decision Making Capacity:   Importance of advance care planning, including choosing a healthcare agent to communicate patient's healthcare decisions if patient lost the ability to make decisions, such as after a sudden illness or accident  Understanding of the healthcare agent role was assessed and information provided    Review of Existing Advance Directive:  none exists    For Serious or Chronic Illness:  Understanding of medical condition      Interventions Provided:  Recommended completion of Advance Directive form after review of ACP materials and conversation with prospective healthcare agent   Recommended review of completed ACP document annually or upon change in health status

## 2015-05-30 NOTE — Progress Notes (Signed)
SUBJECTIVE  Chief Complaint   Patient presents with   ??? Hospital Follow Up     [facility] ED 05/16/15 for mouth/head pain; per patient there was a hairline fracture in jaw    ??? Hypertension     Chronically medically noncomplaint patient here for follow-up.  He has had issues with transportation he says. Still did not bring in medications as requested.    Patient presents for follow-up on his hypertension. He says he is not taking his meds as prescribed due to running out.  He denies any side effects with the medications when he is on them.  The patient denies any chest pain or chest tightness.  Denies any dyspnea upon exertion.  The patient denies any blurred vision or double vision.      With respect to his depression survey, he says that he has had a lot of stress due to personal issues at home and wants to be back in [place] where he will move in the coming months.    He saw a new hematologist since Dr. Arnette Felts left and he says he will not go back.  He says he did not like the doctor.  He also says that he does not like Dr. Susie Cassette office as well.  He says he gets his blood thinner via the mail and has about an 8 month supply.  His oncology notes indicate that Dr. Eulogio Ditch would not manage any narcotic type meds.  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      He was due for CRCS but he has not followed through.  We contacted GI to find this out and to let them know.    OBJECTIVE    Blood pressure (!) 146/98, pulse 76, temperature 99.6 ??F (37.6 ??C), temperature source Oral, resp. rate 16, height  (1.956 m), weight 337 lb (152.9 kg), SpO2 98 %.  General:  Alert, cooperative, well appearing, in no apparent distress.  Vasc:  No swelling.  No carotid bruits.  No JVD.  CV:  The heart sounds are regular in rate and rhythm.  There is a normal S1 and S2.  There or no murmurs.  Lungs:  Inspiratory and expiratory efforts are full and unlabored.  Lung  sounds are clear and equal to auscultation throughout all lung fields without wheezing, rales, or rhonchi.  Psych: normal affect.  Mood good.  Oriented x 3.  Judgement and insight intact.     ASSESSMENT / PLAN    ICD-10-CM ICD-9-CM    1. Essential hypertension I10 401.9 atenolol (TENORMIN) 100 mg tablet      amLODIPine-benazepril (LOTREL) 10-40 mg per capsule   2. Hypercholesterolemia E78.00 272.0 atorvastatin (LIPITOR) 40 mg tablet   3. Prediabetes R73.03 790.29    4. Obesity, unspecified obesity severity, unspecified obesity type E66.9 278.00    5. Deep vein thrombosis (DVT) of proximal lower extremity, unspecified chronicity, unspecified laterality (HCC) I82.4Y9 453.41    6. Adenomatous polyp of colon, unspecified part of colon D12.6 211.3    7. Medically noncompliant Z91.19 V15.81      HTN - Uncontrolled due to noncompliance.  Refills of meds.     Hypercholesterolemia - refills of Lipitor.      Prediabetes - has had a stable A1c.      Obesity - dietary guidance.     DVT - advised on the utmost importance he finds a hematologist to manage this.  He is being very difficult with respect to not wanting to go to anyone locally but also stating that he cannot travel.      Adenomatous polyp - noncompliant with follow-up.   Suggested he sees GLS.    All chart history elements were reviewed by me at the time of the visit even though marked at time of note closure. Patient understands our medical plan. Patient has provided input and agrees with goals. Alternatives have been explained and offered.  All questions answered.  The patient is to call if condition worsens or fails to improve.     Follow-up Disposition:  Return in about 3 months (around 08/29/2015) for routine care/HTN .

## 2015-05-30 NOTE — Progress Notes (Signed)
Chief Complaint   Patient presents with   ??? Hospital Follow Up     [facility] ED 05/16/15 for mouth/head pain; per patient there was a hairline fracture in jaw    ??? Hypertension     Per patient he has been out of BP medication x 1 week or more.  No eye exam in 2016.    1. Have you been to the ER, urgent care clinic since your last visit?  Hospitalized since your last visit?Yes [facility]    2. Have you seen or consulted any other health care providers outside of the [facility] since your last visit?  Include any pap smears or colon screening. Yes Where: [facility] clinic

## 2015-05-30 NOTE — Patient Instructions (Addendum)
A Healthy Lifestyle: Care Instructions  Your Care Instructions  A healthy lifestyle can help you feel good, stay at a healthy weight, and have plenty of energy for both work and play. A healthy lifestyle is something you can share with your whole family.  A healthy lifestyle also can lower your risk for serious health problems, such as high blood pressure, heart disease, and diabetes.  You can follow a few steps listed below to improve your health and the health of your family.  Follow-up care is a key part of your treatment and safety. Be sure to make and go to all appointments, and call your doctor if you are having problems. It???s also a good idea to know your test results and keep a list of the medicines you take.  How can you care for yourself at home?  ?? Do not eat too much sugar, fat, or fast foods. You can still have dessert and treats now and then. The goal is moderation.  ?? Start small to improve your eating habits. Pay attention to portion sizes, drink less juice and soda pop, and eat more fruits and vegetables.  ?? Eat a healthy amount of food. A 3-ounce serving of meat, for example, is about the size of a deck of cards. Fill the rest of your plate with vegetables and whole grains.  ?? Limit the amount of soda and sports drinks you have every day. Drink more water when you are thirsty.  ?? Eat at least 5 servings of fruits and vegetables every day. It may seem like a lot, but it is not hard to reach this goal. A serving or helping is 1 piece of fruit, 1 cup of vegetables, or 2 cups of leafy, raw vegetables. Have an apple or some carrot sticks as an afternoon snack instead of a candy bar. Try to have fruits and/or vegetables at every meal.  ?? Make exercise part of your daily routine. You may want to start with simple activities, such as walking, bicycling, or slow swimming. Try to be active 30 to 60 minutes every day. You do not need to do all 30 to 60  minutes all at once. For example, you can exercise 3 times a day for 10 or 20 minutes. Moderate exercise is safe for most people, but it is always a good idea to talk to your doctor before starting an exercise program.  ?? Keep moving. Mow the lawn, work in the garden, or clean your house. Take the stairs instead of the elevator at work.  ?? If you smoke, quit. People who smoke have an increased risk for heart attack, stroke, cancer, and other lung illnesses. Quitting is hard, but there are ways to boost your chance of quitting tobacco for good.  ?? Use nicotine gum, patches, or lozenges.  ?? Ask your doctor about stop-smoking programs and medicines.  ?? Keep trying.  In addition to reducing your risk of diseases in the future, you will notice some benefits soon after you stop using tobacco. If you have shortness of breath or asthma symptoms, they will likely get better within a few weeks after you quit.  ?? Limit how much alcohol you drink. Moderate amounts of alcohol (up to 2 drinks a day for men, 1 drink a day for women) are okay. But drinking too much can lead to liver problems, high blood pressure, and other health problems.  Family health  If you have a family, there are many things you can do   together to improve your health.  ?? Eat meals together as a family as often as possible.  ?? Eat healthy foods. This includes fruits, vegetables, lean meats and dairy, and whole grains.  ?? Include your family in your fitness plan. Most people think of activities such as jogging or tennis as the way to fitness, but there are many ways you and your family can be more active. Anything that makes you breathe hard and gets your heart pumping is exercise. Here are some tips:  ?? Walk to do errands or to take your child to school or the bus.  ?? Go for a family bike ride after dinner instead of watching TV.  Where can you learn more?  Go to InsuranceStats.cahttp://www.healthwise.net/GoodHelpConnections.   Enter ([phone])277-8868U807 in the search box to learn more about "A Healthy Lifestyle: Care Instructions."  Current as of: September 11, 2014  Content Version: 11.2  ?? 2006-2017 [employer]. Care instructions adapted under license by [employer] (which disclaims liability or warranty for this information). If you have questions about a medical condition or this instruction, always ask your healthcare professional. Healthwise, Incorporated disclaims any warranty or liability for your use of this information.    Follow up in 3 months for routine care/HTN

## 2015-05-30 NOTE — Progress Notes (Signed)
Chief Complaint   Patient presents with   ??? Annual Wellness Visit     PHQ 2 / 9, over the last two weeks 05/30/2015   Little interest or pleasure in doing things Nearly every day   Feeling down, depressed or hopeless Nearly every day   Total Score PHQ 2 6     Abuse Screening Questionnaire 05/30/2015   Do you ever feel afraid of your partner? N   Are you in a relationship with someone who physically or mentally threatens you? N   Is it safe for you to go home? Y     Fall Risk Assessment, last 12 mths 05/30/2015   Able to walk? Yes   Fall in past 12 months? Yes   Fall with injury? No   Number of falls in past 12 months 2   Fall Risk Score 2     1. Have you been to the ER, urgent care clinic since your last visit?  Hospitalized since your last visit?Yes [facility]    2. Have you seen or consulted any other health care providers outside of the [facility] since your last visit?  Include any pap smears or colon screening. Yes Where: [facility] clinic

## 2015-05-30 NOTE — Progress Notes (Signed)
Spoke with GLST, per Sean [name] patient was scheduled for 05/21/2014 canceled and  rescheduled to 05/31/2014 canceled and rescheduled to 06/22/2014 and no-showed.

## 2015-05-30 NOTE — Progress Notes (Signed)
This is a Subsequent Medicare Annual Wellness Visit providing Personalized Prevention Plan Services (PPPS) (Performed 12 months after initial AWV and PPPS )    I have reviewed the patient's medical history in detail and updated the computerized patient record.     History     Past Medical History:   Diagnosis Date   ??? Cyst of left kidney     referred to urology   ??? DVT (deep venous thrombosis) ([place])    ??? GERD (gastroesophageal reflux disease)    ??? 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 H/O esophagogastroduodenoscopy 11/16/11    Dr. Sula Rumple; normal stomach and duodenum; hiatal hernia in the cardia   ??? High cholesterol    ??? History of stress test 06/2012    [facility] - negative stress ECHO   ??? Hypertension    ??? Prediabetes    ??? S/P colonoscopy with polypectomy 11/16/11    Dr. Sula Rumple; polyps x 3; otherwise normal colonoscopy; f/u with gastro in 1 year   ??? Thromboembolus ([place])     last one 5 to 6 months ago   ??? Unspecified sleep apnea     no CPAP      Past Surgical History:   Procedure Laterality Date   ??? ABDOMEN SURGERY PROC UNLISTED      ulcer in stomach removed   ??? HX OTHER SURGICAL  3 years ago    Port A Cath, inserted and removed     Current Outpatient Prescriptions   Medication Sig Dispense Refill   ??? atenolol (TENORMIN) 100 mg tablet Take 1 Tab by mouth daily. 30 Tab 0   ??? atorvastatin (LIPITOR) 40 mg tablet Take 1 Tab by mouth daily. 30 Tab 0   ??? fondaparinux (ARIXTRA) 10 mg/0.8 mL syrg 7.5 mg by SubCUTAneous route two (2) times a day. Indications: DEEP VEIN THROMBOSIS PREVENTION     ??? glucose blood VI test strips (ASCENSIA AUTODISC VI, ONE TOUCH ULTRA TEST VI) strip Check fasting and 1 hour after eating as needed 1 Package 11   ??? Blood-Glucose Meter monitoring kit Use as directed 1 kit 0   ??? Lancets misc Use as directed 1 Package 11   ??? zolpidem (AMBIEN) 10 mg tablet Take 10 mg by mouth nightly as needed.     ??? omeprazole (PRILOSEC) 20 mg capsule Take 20 mg by mouth daily.        ??? cloNIDine HCl (CATAPRES) 0.1 mg tablet Take 1 Tab by mouth two (2) times a day. 60 Tab 3   ??? cyclobenzaprine (FLEXERIL) 5 mg tablet Take 1 Tab by mouth nightly. 7 Tab 0   ??? naproxen (NAPROSYN) 500 mg tablet Take 1 Tab by mouth two (2) times daily (with meals). 20 Tab 0   ??? amLODIPine-benazepril (LOTREL) 10-40 mg per capsule Take 1 Cap by mouth daily. 30 Cap 0   ??? ergocalciferol (VITAMIN D2) 50,000 unit capsule Take 50,000 Units by mouth every Monday and Friday.       No Known Allergies  Family History   Problem Relation Age of Onset   ??? Heart Attack Mother    ??? Hypertension Mother    ??? Arthritis-rheumatoid Mother    ??? Heart Failure Mother    ??? High Cholesterol Mother    ??? Hypertension Father    ??? Alcohol abuse Father    ??? Other Father      Tobacco Use     Social History   Substance Use Topics   ???  Smoking status: Current Every Day Smoker     Packs/day: 0.80     Years: 15.00     Types: Cigarettes   ??? Smokeless tobacco: Never Used   ??? Alcohol use No     Patient Active Problem List   Diagnosis Code   ??? HTN (hypertension) I10   ??? DVT (deep venous thrombosis) (HCC) I82.409   ??? Hypercholesterolemia E78.00   ??? Obesity E66.9   ??? Other dysphagia R13.19   ??? Special screening for malignant neoplasms, colon Z12.11   ??? Prediabetes R73.03   ??? Advance care planning Z71.89       Depression Risk Factor Screening:     PHQ 2 / 9, over the last two weeks 05/30/2015   Little interest or pleasure in doing things Nearly every day   Feeling down, depressed or hopeless Nearly every day   Total Score PHQ 2 6     Alcohol Risk Factor Screening:   On any occasion during the past 3 months, have you had more than 4 drinks containing alcohol?  No    Do you average more than 14 drinks per week?  No      Functional Ability and Level of Safety:     Hearing Loss   none    Activities of Daily Living   Self-care.   Requires assistance with: no ADLs    Fall Risk     Fall Risk Assessment, last 12 mths 05/30/2015   Able to walk? Yes    Fall in past 12 months? Yes   Fall with injury? No   Number of falls in past 12 months 2   Fall Risk Score 2     Abuse Screen   Patient is not abused    Review of Systems   A comprehensive review of systems was negative except for that written in the HPI.    Physical Examination     Evaluation of Cognitive Function:  Mood/affect:  neutral  Appearance: age appropriate and casually dressed  Family member/caregiver input: none present today    Blood pressure (!) 146/98, pulse 76, temperature 99.6 ??F (37.6 ??C), temperature source Oral, resp. rate 16, height '6\' 5"'$  (1.956 m), weight 337 lb (152.9 kg).    Patient Care Team:  Truitt Leep, MD as PCP - General (Family Practice)  Aris Georgia, MD (Hematology and Oncology)  Lucretia Field, MD (Gastroenterology)  Darrin Nipper, MD (Orthopedic Surgery)  Daphene Calamity, MD (Ophthalmology)  Steva Colder, MD (Pain Management)  Lia Hopping, MD as Surgeon (General Surgery)    Advice/Referrals/Counseling   Education and counseling provided:  Are appropriate based on today's review and evaluation    Assessment/Plan       ICD-10-CM ICD-9-CM    1. Routine general medical examination at a health care facility Z00.00 V70.0    2. Advanced directives, counseling/discussion Z71.89 V65.49 ADVANCE CARE PLANNING FIRST 30 MINS   3. Screening for depression Z13.89 V79.0 DEPRESSION SCREEN ANNUAL     Age and sex specific counseling.    Screening for depression and alcoholism.     Advanced directives / end of life planning discussion - see ACP note.     Care team updated.      Medicare recommended screening and prevention test table is filled out, reviewed, and provided to patient.        Screening and prevention is not up to date. He is overdue to see GI.  He has several socioeconomic challenges.  Patient understands our medical plan. Patient has provided input and agrees with goals.  All questions answered.

## 2015-06-21 ENCOUNTER — Encounter

## 2015-06-21 NOTE — Telephone Encounter (Signed)
This pharmacy faxed over request for the following prescriptions to be filled: Request 90 supply    Medication requested :   Requested Prescriptions     Pending Prescriptions Disp Refills   ??? amLODIPine-benazepril (LOTREL) 10-40 mg per capsule 30 Cap 3     Sig: Take 1 Cap by mouth daily.     PCP: [facility]  Pharmacy or Print: [facility]  Mail order or Local pharmacy Mail Order     Scheduled appointment if not seen by current providers in office: LOV 05/30/2015 No f/u up Scheduled at this time.  Due 05/29/2016

## 2015-06-21 NOTE — Telephone Encounter (Signed)
Spoke with patient.  [facility]  Patient would like to use local pharmacy not [facility].

## 2015-07-11 ENCOUNTER — Ambulatory Visit: Admit: 2015-07-11 | Discharge: 2015-07-11 | Payer: MEDICARE | Attending: Family Medicine

## 2015-07-11 DIAGNOSIS — E118 Type 2 diabetes mellitus with unspecified complications: Secondary | ICD-10-CM

## 2015-07-11 LAB — AMB POC URINE, MICROALBUMIN, SEMIQUANT (3 RESULTS)
ALBUMIN, URINE POC: 80 mg/L
CREATININE, URINE POC: 300 mg/dL
Microalbumin/creat ratio (POC): 30 mg/g

## 2015-07-11 LAB — AMB POC HEMOGLOBIN A1C: Hemoglobin A1c (POC): 6.9 %

## 2015-07-11 NOTE — Progress Notes (Signed)
Colonoscopy: overdue   Eye exam: overdue  Foot exam:

## 2015-07-11 NOTE — Progress Notes (Signed)
SUBJECTIVE  Chief Complaint   Patient presents with   ??? Form Completion   ??? Hypertension   ??? Cholesterol Problem   ??? Other     prediabetes     Chronically medically noncomplaint patient here for follow-up and to have forms filled out for proof of disability status for housing accomodations.      Still does not bring in medications as requested on repeated occasions.    Patient presents for follow-up on his hypertension. He says he is taking his meds as prescribed.  He denies any side effects with the medications when he is on them.  The patient denies any chest pain or chest tightness.  Denies any dyspnea upon exertion.  The patient denies any blurred vision or double vision.      He saw a new hematologist since Dr. Arnette Felts left and he says he will not go back.  He says he did not like the doctor.  He also says that he does not like Dr. Susie Cassette office as well.  He says he gets his blood thinner via the mail and has about an 8 month supply.  His oncology notes indicate that Dr. Eulogio Ditch would not manage any narcotic type meds.  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  He says this is the reason he is on chronic disability and due to this needs a walk in shower and a first floor suite.     He was due for CRCS but he has not followed through.  We contacted GI to find this out and to let them know.    ROS:  History obtained from the patient  ?? Respiratory: no cough, shortness of breath, or wheezing  ?? Cardiovascular: no chest pain, palpitations, or dyspnea on exertion  ?? Gastrointestinal: no abdominal pain, N/V, change in bowel habits, or black or bloody stools  ?? Neurological: no numbness, tingling, headache or dizziness   ?? GU: no hematuria, dysuria, frequency, hesitancy, or nocturia.    ?? Psychological: negative for - anxiety, depression, sleep disturbances, suicidal or homicidal ideations    OBJECTIVE     Blood pressure 134/84, pulse 89, temperature 98.3 ??F (36.8 ??C), temperature source Oral, resp. rate 16, height  (1.956 m), weight 336 lb (152.4 kg), SpO2 98 %.  General:  Alert, cooperative, well appearing, in no apparent distress.  Vasc:  No swelling.  No carotid bruits.  No JVD.  CV:  The heart sounds are regular in rate and rhythm.  There is a normal S1 and S2.  There or no murmurs.  Lungs:  Inspiratory and expiratory efforts are full and unlabored.  Lung sounds are clear and equal to auscultation throughout all lung fields without wheezing, rales, or rhonchi.  Psych: normal affect.  Mood good.  Oriented x 3.  Judgement and insight intact.     Results for orders placed or performed in visit on 07/11/15   AMB POC HEMOGLOBIN A1C   Result Value Ref Range    Hemoglobin A1c (POC) 6.9 %   AMB POC URINE, MICROALBUMIN, SEMIQUANT (3 RESULTS)   Result Value Ref Range    ALBUMIN, URINE POC 80 Negative mg/L    CREATININE, URINE POC 300 mg/dL    Microalbumin/creat ratio (POC) 30 mg/g     ASSESSMENT / PLAN    ICD-10-CM ICD-9-CM    1. Type 2 diabetes mellitus with complication, without long-term current use of insulin (  HCC) E11.8 250.90 AMB POC HEMOGLOBIN A1C      AMB POC URINE, MICROALBUMIN, SEMIQUANT (3 RESULTS)   2. Essential hypertension I10 401.9    3. Hypercholesterolemia E78.00 272.0    4. Obesity, unspecified obesity severity, unspecified obesity type E66.9 278.00    5. Deep vein thrombosis (DVT) of proximal lower extremity, unspecified chronicity, unspecified laterality (HCC) I82.4Y9 453.41    6. Medically noncompliant Z91.19 V15.81      Diabetes - A1c is rising.  He is very disengaged with his care and does not check blood sugars.  I have shared with him that this is potentially dangerous for him.  I have advised him on the need for annual eye exams.     HTN - DASH dieting recommended. Refills of meds.     Hypercholesterolemia - refills of Lipitor.  In the past LDL has been  uncontrolled.  Lipid lowering therapy not prescibed for patient reasons.  It is prescribed but due to patient reasons (noncompliance) this is not under control.      Obesity - dietary guidance.     DVT - advised on the utmost importance he finds a hematologist to manage this.  I have asked that before I fill out any validation that he is on disability I need clarification from his case manager on more details of exactly why so I do not commit any fraudulent activities on his behalf.        Adenomatous polyp - noncompliant with follow-up.   Suggested he sees GLS.    All chart history elements were reviewed by me at the time of the visit even though marked at time of note closure. Patient understands our medical plan. Patient has provided input and agrees with goals. Alternatives have been explained and offered.  All questions answered.  The patient is to call if condition worsens or fails to improve.     Follow-up Disposition:  Return in about 3 months (around 10/11/2015) for routine care.

## 2015-07-18 NOTE — Telephone Encounter (Signed)
The paperwork from the disability office is insufficient.  We will talk to his social worker for advice on how to get the letter we are requesting from the disability office.

## 2015-07-18 NOTE — Telephone Encounter (Signed)
Received incoming call from Sean [name] sister Sean Philips([name]) which is on his HIPPA. Tried explainin to her several times,but kept getting cut off with her yelling back and forth between I and her brother Systems analyst([name]). I finally got a chance to explain to her that per [name] letter given to Sean [name] on 07-11-2015 he needed to get a letter from social worker or disability stating proof of disability and diagnosis. She states they talk to the social worker Mrs. Ray who informed them that they need to go to social security/disabilty office. This is why they brought the disability records to [name].  Sean [name] then yells stating [name] did not give her brother a letter at his appointment. Sean [name] in the background stating he did. Explain to them both that I would ask [name] to review the disability records again on there behalf.

## 2015-07-18 NOTE — Telephone Encounter (Signed)
Spoke with Sean [name] to inform him that per [name] he requested a letter from his social worker stating what how long patient has been on disability and what diagnosis for appropriate accommodations. Sean [name] gave the information of Mrs. Ray at [employer]

## 2015-07-19 NOTE — Telephone Encounter (Signed)
Left message via voicemail for Mrs. Verline LemaRea to call [facility]

## 2015-07-19 NOTE — Telephone Encounter (Signed)
Late Entry: Left message via voicemail [phone] for Mrs. Rea on 07-18-2015 to call [facility]

## 2015-07-19 NOTE — Telephone Encounter (Signed)
Received incoming call from Social Services (Sean [name]). She states that Sean [name] would have to get this letter/award letter from social security/disability services ( she did explain that this would probably be a long process). Sean [name] states there is nothing she can do in reference to this letter/award letter and has made Sean [name] aware of what must be done.     Called [phone] spoke with a Sean [name] she states they no longer have his case due to they are the determination board. Sean [name] was approved for disability benefits on 03-20-1999 and that we need to contact now his local social security/disability office for further information. Sean [name] also stated that he should be receiving a yearly letter stating his disability benefits, but diagnosis will not be on it.  Sean [name] then gave me the number [phone] to contact that has his case now.    Called the  social security/disability office [phone] spoke with Sean [name]. I explain to Sean [name] what we are in need of for Sean [name]. [name] stated all Sean [name] has to do is go to his local office and ask for his latest award letter of disability and a BBQY letter as well. [name] states that he will  Receive those letters on the day of request  and that they do not write letters. This is why they send out yearly award letter due to the volume of people that is on  Disability       Spoke with Sean [name] to Inform him what social security/disability states that he needs his last award letter along Sean [name] letter and that he can go to his local office for this.

## 2015-07-22 NOTE — Telephone Encounter (Signed)
Forms sign and given to Casimiro NeedleMichael on Monday June 05,2017

## 2015-09-11 NOTE — Telephone Encounter (Signed)
Place call to Sylvio to inform him that he has ophthalmology appointment as follows:    Appointment made for patient to see:    [facility]: [facility] (Dr. Oletta Lamas)    Specialty: Ophthalmology     Address/telephone #: [phone]  [place]. [address] [phone]    Appointment date/time:August [phone] @ 10:00 am    Diagnosis: Diabetic Eye Exam    Patient notified of appointment information: Left message for [employer] to call [facility] on 09-11-2015

## 2015-09-11 NOTE — Telephone Encounter (Signed)
Received incoming call from Casimiro Needle in reference to ophthalmology appointment. [place] was given all appointment  Information and ask to repeat back for full understanding and clarification.

## 2015-10-10 ENCOUNTER — Ambulatory Visit: Admit: 2015-10-10 | Discharge: 2015-10-10 | Payer: MEDICARE | Attending: Family Medicine

## 2015-10-10 DIAGNOSIS — E1129 Type 2 diabetes mellitus with other diabetic kidney complication: Secondary | ICD-10-CM

## 2015-10-10 LAB — AMB POC HEMOGLOBIN A1C: Hemoglobin A1c (POC): 8.4 %

## 2015-10-10 MED ORDER — ATENOLOL 100 MG TAB
100 mg | ORAL_TABLET | Freq: Every day | ORAL | 3 refills | Status: DC
Start: 2015-10-10 — End: 2015-11-14

## 2015-10-10 MED ORDER — AMLODIPINE-BENAZEPRIL 10 MG-40 MG CAP
10-40 mg | ORAL_CAPSULE | Freq: Every day | ORAL | 3 refills | Status: DC
Start: 2015-10-10 — End: 2015-11-14

## 2015-10-10 MED ORDER — METFORMIN 1,000 MG TAB
1000 mg | ORAL_TABLET | Freq: Two times a day (BID) | ORAL | 2 refills | Status: DC
Start: 2015-10-10 — End: 2017-04-05

## 2015-10-10 MED ORDER — ATORVASTATIN 40 MG TAB
40 mg | ORAL_TABLET | Freq: Every day | ORAL | 3 refills | Status: DC
Start: 2015-10-10 — End: 2016-01-13

## 2015-10-10 MED ORDER — TRAZODONE 50 MG TAB
50 mg | ORAL_TABLET | Freq: Every evening | ORAL | 2 refills | Status: DC
Start: 2015-10-10 — End: 2015-10-10

## 2015-10-10 NOTE — Progress Notes (Signed)
SUBJECTIVE  Chief Complaint   Patient presents with   ??? Cholesterol Problem   ??? Diabetes   ??? Blood Clot     HX of DVT   ??? Hypertension   ??? Obesity     Chronically medically noncomplaint patient here for follow-up.      Still does not bring in medications as requested on repeated occasions.    Patient presents for follow-up on his hypertension. Reports taking his meds as prescribed.  He denies any side effects.  The patient denies any chest pain or chest tightness.  Denies any dyspnea upon exertion.        He is seeing yet another new hematologist soon.  He has seen a few after Dr. Linda Hedges relocated to [place].  He says he is compliant with his blood thinner for his DVT.  His oncology notes indicate that Dr. Eulogio Ditch would not manage any narcotic type meds.  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      He was due for CRCS but he has not followed through.  We have contacted GI in the past to find this out and to let them know.  He has reported chronic reflux as well.  He has had EGD with Dr. Eulogio Ditch.     ROS:  History obtained from the patient  ?? Respiratory: no cough, shortness of breath, or wheezing  ?? Cardiovascular: no chest pain, palpitations, or dyspnea on exertion  ?? Gastrointestinal: no abdominal pain, N/V, change in bowel habits, or black or bloody stools  ?? Neurological: no numbness, tingling, headache or dizziness   ?? GU: no hematuria, dysuria, frequency, hesitancy, or nocturia.    ?? Psychological: negative for - anxiety, depression, suicidal or homicidal ideations.  He has had chronic insomnia.  I offered for him to try Trazodone but he thinks it make him too drowsy.      OBJECTIVE    Blood pressure 130/76, pulse 68, temperature 98.1 ??F (36.7 ??C), temperature source Oral, resp. rate 16, height 6\' 5"  (1.956 m), weight 330 lb (149.7 kg), SpO2 98 %.  General:  Alert, cooperative, well appearing, in no apparent distress.   Vasc:  No swelling.  No carotid bruits.  No JVD.  CV:  The heart sounds are regular in rate and rhythm.  There is a normal S1 and S2.  There or no murmurs.  Lungs:  Inspiratory and expiratory efforts are full and unlabored.  Lung sounds are clear and equal to auscultation throughout all lung fields without wheezing, rales, or rhonchi.  Abd: soft, nontender, nondistended.  No masses.  Skin:  No pedal lesions.   Psych: normal affect.  Mood good.  Oriented x 3.  Judgement and insight intact.     Results for orders placed or performed in visit on 10/10/15   AMB POC HEMOGLOBIN A1C   Result Value Ref Range    Hemoglobin A1c (POC) 8.4 %     ASSESSMENT / PLAN    ICD-10-CM ICD-9-CM    1. Controlled type 2 diabetes mellitus with microalbuminuria, without long-term current use of insulin (HCC) E11.29 250.40 HEMOGLOBIN A1C W/O EAG    R80.9 791.0 METABOLIC PANEL, COMPREHENSIVE      LIPID PANEL      AMB POC HEMOGLOBIN A1C   2. Essential hypertension I10 401.9 atenolol (TENORMIN) 100 mg tablet      amLODIPine-benazepril (LOTREL) 10-40 mg per capsule  METABOLIC PANEL, COMPREHENSIVE      LIPID PANEL   3. Hypercholesterolemia E78.00 272.0 atorvastatin (LIPITOR) 40 mg tablet      LIPID PANEL   4. Obesity, unspecified obesity severity, unspecified obesity type E66.9 278.00    5. Deep vein thrombosis (DVT) of proximal lower extremity, unspecified chronicity, unspecified laterality (HCC) I82.4Y9 453.41    6. Gastroesophageal reflux disease, esophagitis presence not specified K21.9 530.81 REFERRAL TO GASTROENTEROLOGY   7. Polyp of colon, unspecified part of colon, unspecified type K63.5 211.3 REFERRAL TO GASTROENTEROLOGY   8. Prostate cancer screening Z12.5 V76.44 PSA W/ REFLX FREE PSA     Diabetes - A1c is rising and is showing a drastic increase.  He is counseled on diabetic dieting.  Offered our resources to him like nurse navigation.  He is very disengaged with his care and does not check blood  sugars.  At his prior visit I have shared with him that this is potentially dangerous for him.  I have advised him on the need for annual eye exams which he has done.   He will start metformin 1000mg  po bid with meals.  Advised on GI side effects.     HTN - DASH dieting recommended. Refills of meds.     Hypercholesterolemia - refills of Lipitor.  In the past LDL has been uncontrolled mainly due to noncompliance.    Obesity - dietary guidance.     DVT - advised on the utmost importance he establishes permanently with a hematologist.       Adenomatous polyp / GERD - noncompliant with follow-up with GI.   Suggested he sees GLS and advised on the importance of this.  I explained the risks of undetected colon cancer.    40 minutes of face to face time spent with the patient with at least 50% on counseling on above medical issues and coordination of care.    All chart history elements were reviewed by me at the time of the visit even though marked at time of note closure. Patient understands our medical plan. Patient has provided input and agrees with goals. Alternatives have been explained and offered.  All questions answered.  The patient is to call if condition worsens or fails to improve.     Follow-up Disposition:  Return in about 3 months (around 01/10/2016) for  routine care and please have 10 hour fasting labs done 3-7 days prior.

## 2015-10-10 NOTE — Progress Notes (Signed)
1. Have you been to the ER, urgent care clinic since your last visit?  Hospitalized since your last visit?No    2. Have you seen or consulted any other health care providers outside of the [facility] since your last visit?  Include any pap smears or colon screening. No     Health Maintenance Due   Topic Date Due   ??? COLONOSCOPY  11/17/2012   ??? INFLUENZA AGE 55 TO ADULT  09/17/2015     Annual eye exam: 09-23-2015  Pneumococcal vaccine: 05-03-2013  Flu vaccine: pt declined 12-17-2014  Patient instructed to remove shoes: Yes

## 2015-10-10 NOTE — Patient Instructions (Addendum)
Learning About Diabetes Food Guidelines  Your Care Instructions  Meal planning is important to manage diabetes. It helps keep your blood sugar at a target level (which you set with your doctor). You don't have to eat special foods. You can eat what your family eats, including sweets once in a while. But you do have to pay attention to how often you eat and how much you eat of certain foods.  You may want to work with a dietitian or a certified diabetes educator (CDE) to help you plan meals and snacks. A dietitian or CDE can also help you lose weight if that is one of your goals.  What should you know about eating carbs?  Managing the amount of carbohydrate (carbs) you eat is an important part of healthy meals when you have diabetes. Carbohydrate is found in many foods.  ?? Learn which foods have carbs. And learn the amounts of carbs in different foods.  ?? Bread, cereal, pasta, and rice have about 15 grams of carbs in a serving. A serving is 1 slice of bread (1 ounce), ?? cup of cooked cereal, or 1/3 cup of cooked pasta or rice.  ?? Fruits have 15 grams of carbs in a serving. A serving is 1 small fresh fruit, such as an apple or orange; ?? of a banana; ?? cup of cooked or canned fruit; ?? cup of fruit juice; 1 cup of melon or raspberries; or 2 tablespoons of dried fruit.  ?? Milk and no-sugar-added yogurt have 15 grams of carbs in a serving. A serving is 1 cup of milk or 2/3 cup of no-sugar-added yogurt.  ?? Starchy vegetables have 15 grams of carbs in a serving. A serving is ?? cup of mashed potatoes or sweet potato; 1 cup winter squash; ?? of a small baked potato; ?? cup of cooked beans; or ?? cup cooked corn or green peas.  ?? Learn how much carbs to eat each day and at each meal. A dietitian or CDE can teach you how to keep track of the amount of carbs you eat. This is called carbohydrate counting.  ?? If you are not sure how to count carbohydrate grams, use the Plate  Method to plan meals. It is a good, quick way to make sure that you have a balanced meal. It also helps you spread carbs throughout the day.  ?? Divide your plate by types of foods. Put non-starchy vegetables on half the plate, meat or other protein food on one-quarter of the plate, and a grain or starchy vegetable in the final quarter of the plate. To this you can add a small piece of fruit and 1 cup of milk or yogurt, depending on how many carbs you are supposed to eat at a meal.  ?? Try to eat about the same amount of carbs at each meal. Do not "save up" your daily allowance of carbs to eat at one meal.  ?? Proteins have very little or no carbs per serving. Examples of proteins are beef, chicken, turkey, fish, eggs, tofu, cheese, cottage cheese, and peanut butter. A serving size of meat is 3 ounces, which is about the size of a deck of cards. Examples of meat substitute serving sizes (equal to 1 ounce of meat) are 1/4 cup of cottage cheese, 1 egg, 1 tablespoon of peanut butter, and ?? cup of tofu.  How can you eat out and still eat healthy?  ?? Learn to estimate the serving sizes of foods that have   carbohydrate. If you measure food at home, it will be easier to estimate the amount in a serving of restaurant food.  ?? If the meal you order has too much carbohydrate (such as potatoes, corn, or baked beans), ask to have a low-carbohydrate food instead. Ask for a salad or green vegetables.  ?? If you use insulin, check your blood sugar before and after eating out to help you plan how much to eat in the future.  ?? If you eat more carbohydrate at a meal than you had planned, take a walk or do other exercise. This will help lower your blood sugar.  What else should you know?  ?? Limit saturated fat, such as the fat from meat and dairy products. This is a healthy choice because people who have diabetes are at higher risk of heart disease. So choose lean cuts of meat and nonfat or low-fat dairy  products. Use olive or canola oil instead of butter or shortening when cooking.  ?? Don't skip meals. Your blood sugar may drop too low if you skip meals and take insulin or certain medicines for diabetes.  ?? Check with your doctor before you drink alcohol. Alcohol can cause your blood sugar to drop too low. Alcohol can also cause a bad reaction if you take certain diabetes medicines.  Follow-up care is a key part of your treatment and safety. Be sure to make and go to all appointments, and call your doctor if you are having problems. It's also a good idea to know your test results and keep a list of the medicines you take.  Where can you learn more?  Go to [employer].  Enter [phone] in the search box to learn more about "Learning About Diabetes Food Guidelines."  Current as of: April 29, 2015  Content Version: 11.3  ?? 2006-2017 [employer]. Care instructions adapted under license by [employer] (which disclaims liability or warranty for this information). If you have questions about a medical condition or this instruction, always ask your healthcare professional. [employer] disclaims any warranty or liability for your use of this information.  Follow up in 3 months for routine care and please have 10 hour fasting labs done 3-7 days prior

## 2015-11-05 NOTE — Progress Notes (Signed)
NNTOCIP  Patient admitted to [facility] on 10/28/15 to 11/01/15 for DKA.      Course of hospitalization per discharge summary:  Hospital Course: HPI per admitting physician:  55 yo M who reports that He was in his USOh until 3 days ago. He reports that he last saw his PCP 3 weeks ago and was started on a medication for his lbood sugar as it was in hthe 400 a the office. He reports that he started that med, but does not know hte name or dose. Over the lat 3 days he has had increased wekness and dry mouth and blurry vision. He reports that it was difficult to focus and sometimes he saw double. No eye pain, no vision loss. He thought it would go away, so he waited until to day to come in. In the ED, he was noted to have elevated blood glucose with acetone, but not frank acodis. Because of his hyperglycemia and symptoms, he was started on glucommander. Admission was requested. By the time the patient arrived from [facility], patient blood glucose had improved and his renal function had completely normalized.   ??  Patient notes that he has been intermittently having headaches and chest palpitations for several months.   ??  Newly diagnosed uncontrolled type II diabetesb with microalbuminuria ( A1C- 11.2)   - Patient was only on metformin at home ( not on insulin at home)   - Transitioned from glucommander to Lantus and SSI .   - Seen by Endocrinology. Patient was discharged on Lantus 60 units, metformin 500 BID and Januvia 100 mg daily   - continued on lisinopril and statin  - Will need outpatient Endocrinology, podiatry and opthal Follow up.   ??  HTN  - continue home meds. Controlled during hospitalization  ??  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  AKI - resolved  ??  History of PUD s/p resection  - on PPI   ??  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 Follows pain management as outpatient. Will need follow up   ??  Morbid obesity  - Educated on weight loss     Pt is hemodynamically stable and medically cleared for DC. All questions answered.     Pertinent labs:  Results for Sean [name], Sean [name] (MRN [id number]) as of 11/05/2015 11:51   Ref. Range 10/28/2015 16:00 11/01/2015 05:57   Glucose  Latest Ref Range: 65 - 99 mg/dL 416 (H) 134 (H)   BUN Latest Ref Range: 6 - 22 mg/dL 11 12   CREATININE Latest Ref Range: 0.5 - 1.2 mg/dL 1.3 (H) 1.0   eGFR African American Latest Ref Range: >60.0  >60.0 >60.0   eGFR Non African American Latest Ref Range: >60.0  55.3 (L) >60.0   SODIUM Latest Ref Range: 133 - 145 mmol/L 134 139   POTASSIUM Latest Ref Range: 3.5 - 5.5 mmol/L 4.3 3.3 (L)   CHLORIDE Latest Ref Range: 98 - 110 mmol/L 92 (L) 103   CO2 Latest Ref Range: 20 - 32 mmol/L 23 22   SGOT (AST) Latest Ref Range: 10 - 37 U/L 55 (H)    SGPT (ALT) Latest Ref Range: 5 - 40 U/L 67 (H)    Alkaline Phosphatase Latest Ref Range: 25 - 115 U/L 130 (H)    BILIRUBIN TOTAL Latest Ref Range: 0.2 - 1.2 mg/dL 0.4    BILIRUBIN DIRECT Latest Ref Range: 0.0 - 0.3 mg/dL <  0.2    TOTAL PROTEIN Latest Ref Range: 6.4 - 8.3 g/dL 8.2    ALBUMIN Latest Ref Range: 3.5 - 5.0 g/dL 4.3    GLOBULIN SERUM Latest Ref Range: 2.0 - 4.0 g/dL 3.9    A/G RATIO Latest Ref Range: 1.1 - 2.6 ratio 1.1    CALCIUM Latest Ref Range: 8.4 - 10.4 mg/dL 10.0 9.4   ANION GAP Latest Units: mmol/L 19.4 13.8   PHOSPHORUS Latest Ref Range: 2.4 - 4.7 mg/dL     MAGNESIUM Latest Ref Range: 1.6 - 2.5 mg/dL     ACETONE Latest Ref Range: Negative mg/dL     LIPASE Latest Ref Range: 7 - 60 U/L 34    HEMOGLOBIN A1C Latest Ref Range: 4.8 - 5.9 %     ESTIMATED AVERAGE GLUCOSE Latest Ref Range: 91 - 123 mg/dL       Inpatient Consults per discharge summary:  Endocrinology  Discharge diagnoses per discharge summary :   Discharge Diagnosis:   1. Newly diagnosed uncontrolled type II diabetes with microalbuminuria   2. HTN  3. Hypercoagulable state with recurrent DVT  4. AKI- resolved  5. History of PUD s/p resection  6. Morvbid obesity     Hospital admissions in past year: 1  ED admissions in past year: 3    Contacted patient for hospital follow up.  Introduced self, role and reason for call. Verified 2 patient identifiers.     Patient reports:  "I am doing much better.  I am taking my medicine like they told me to.  I take a shot at night and a new pill in the morning."  Patient stated that he had gotten a scare last week for two days his vision was so blurred he could not read the signs in the grocery store and that was when he decided to go to the ED.  A nurse came out yesterday and educated him on what to do if his blood sugar was too low.    Patient denies:  Blurred vision headaches.  Any problems giving himself a shot or testing his blood sugar.    ADL's:  Preforms all ADL's  by self with out difficulty     DME:   None    Resources/Support:   Assistance from family  AMD:   Not on file  Reconciled home medications and reviewed allergies. Instructed to bring all medications with him to next appointment.  I also asked that patient write down everything he eats each day for the next week until we meet. Educated patient to monitor and report the following Red flags: headache, blurred vision, blood sugars greater than 400 and less than 70 or any new or concerning symptoms. Patient verbalized understanding of information discussed and is aware of  when to seek medical attention from PCP, urgent care or ED. Opportunity to ask questions was provided. Contact information was provided for future reference or further questions.    Appointments:  11/14/2015 1:15 PM Truitt Leep, MD [facility] Endocrinology  December 05, 2015 [place] [address]    Patient aware of appointments. His sister will provide transportation.     Potential Barriers to care  No apparent barriers to care identified at this time.      Adherence to previous treatment and likelihood for follow-up:   Patient verbalized understanding of discharge instructions and need for follow up.     Plan of  Care/Goals:  Goals     ??? Knowledge and adherence of medication (ie. action, side effects, missed dose, etc.)     ??? Reduce ED Utilization     ??? Skills necessary to properly manage their diabetes, including use of devices and symptom monitoring tools.     ??? To decrease or maintain patient's biometrics:  HgA1c ?7 mg/dL, BP 130/80 or less. LDL of less than 100 and/or less than 70 in a patient with CAD.           This represents Transitions of Care. Nurse Navigator spoke with patient within 1-2 business day(s) of discharge. Patient's transition of care follow up appointment is scheduled with Truitt Leep, MD on 11/14/2015 at 1315 which is within 7-14 days of discharge.   Call placed to Endocrinology at the patients request.  Spoke with Bebe Shaggy at [facility]  Endocr[facility]ology and appointment made for patient.

## 2015-11-05 NOTE — Addendum Note (Signed)
Addended by: Dot LanesYOUR, Avon Mergenthaler W on: 11/05/2015 02:43 PM      Modules accepted: Orders

## 2015-11-14 ENCOUNTER — Ambulatory Visit: Admit: 2015-11-14 | Discharge: 2015-11-14 | Payer: MEDICARE | Attending: Family Medicine

## 2015-11-14 DIAGNOSIS — IMO0002 Reserved for concepts with insufficient information to code with codable children: Secondary | ICD-10-CM

## 2015-11-14 MED ORDER — ATENOLOL 100 MG TAB
100 mg | ORAL_TABLET | Freq: Every day | ORAL | 3 refills | Status: DC
Start: 2015-11-14 — End: 2016-09-22

## 2015-11-14 MED ORDER — INSULIN GLARGINE 100 UNIT/ML INJECTION
100 unit/mL | Freq: Every evening | SUBCUTANEOUS | 0 refills | Status: AC
Start: 2015-11-14 — End: 2015-12-14

## 2015-11-14 MED ORDER — OMEPRAZOLE 20 MG CAP, DELAYED RELEASE
20 mg | ORAL_CAPSULE | Freq: Every day | ORAL | 3 refills | Status: DC
Start: 2015-11-14 — End: 2016-09-22

## 2015-11-14 MED ORDER — AMLODIPINE-BENAZEPRIL 10 MG-40 MG CAP
10-40 mg | ORAL_CAPSULE | Freq: Every day | ORAL | 3 refills | Status: DC
Start: 2015-11-14 — End: 2016-09-22

## 2015-11-14 NOTE — Progress Notes (Signed)
Physician order obtained. Patient completed adult immunization consent form.  Allergies, contraindications and recommendations reviewed with patient. Seasonal influenza vaccine administered IM right deltoid.  Patient tolerated well.  Patient remained in office for 15 minutes after injection and no adverse reactions were noted.

## 2015-11-14 NOTE — Progress Notes (Signed)
SUBJECTIVE  Chief Complaint   Patient presents with   ??? Hospital Follow Up     [name] is a 55 y.o. year old male, he is seen today for Transition of Care services following a hospital discharge for uncontrolled diabetes on 11/01/2015. He was admitted on 10/28/2015.  Our office Nurse Navigator performed an outreach to Sean [name] on 11/05/2015 (within 2 business days of discharge) to complete medication reconciliation and a telephonic assessment of his condition.    Patient presents for hospital follow-up.  We reviewed the recent hospitalization in detail.  The patient reports doing much better.  The patient denies any complaints at this time.   He says he was started on Januvia 100mg  daily and Lantus 60U qhs.  He is still on his metformin.  His sugars in the morning are consistently below 100.  They are in the 80-100 range.   He had a CT scan of his head due to visual changes on presentation.  It was normal besides age-related microvascular changes.     He has been largely noncompliant with follow-up with us and with specialists.  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   He has chronic pain and missed his pain management appointment.      OBJECTIVE    Blood pressure 126/70, pulse 72, temperature 99.3 ??F (37.4 ??C), temperature source Oral, resp. rate 16, height 6\' 5"  (1.956 m), weight 340 lb (154.2 kg), SpO2 98 %.  General:  Alert, cooperative, well appearing, in no apparent distress.  Head:  Head is symmetric, normocephalic without evidence of trauma or deformity.  Eyes:  Pupils are equally round and reactive to light with accommodation.  The extra-ocular movements are intact.  The lids are without swelling, lesions, or drainage.  The conjunctiva are clear and noninjected.  ENT:    The tongue and mucous membranes are pink and moist without lesions.   Neck:  The thyroid is normal size without nodules or masses.  There is no  lymphadenopathy.  There are no carotid bruits.  CV:  The heart sounds are regular in rate and rhythm.  There is a normal S1 and S2.   Lungs:  Inspiratory and expiratory efforts are full and unlabored.  Lung sounds are clear and equal to auscultation throughout all lung fields without wheezing, rales, or rhonchi.  Skin:  No pedal rashes, no jaundice.  Psych: normal affect.  Mood good.  Oriented x 3.  Judgement and insight intact.     ASSESSMENT / PLAN    ICD-10-CM ICD-9-CM    1. Uncontrolled type 2 diabetes mellitus with microalbuminuria, without long-term current use of insulin (HCC) E11.29 250.42 insulin glargine (LANTUS) 100 unit/mL injection    E11.65 791.0     R80.9     2. Deep vein thrombosis (DVT) of proximal lower extremity, unspecified chronicity, unspecified laterality (HCC) I82.4Y9 453.41 fondaparinux (ARIXTRA) 10 mg/0.8 mL syrg      REFERRAL TO ONCOLOGY   3. Essential hypertension I10 401.9 amLODIPine-benazepril (LOTREL) 10-40 mg per capsule      atenolol (TENORMIN) 100 mg tablet   4. Hypercholesterolemia E78.00 272.0    5. Morbid obesity due to excess calories (HCC) E66.01 278.01    6. Gastroesophageal reflux disease without esophagitis K21.9 530.81 omeprazole (PRILOSEC) 20 mg capsule   7. Noncompliance Z91.19 V15.81    8. Encounter for immunization Z23 V03.89 INFLUENZA VIRUS VAC QUAD,SPLIT,PRESV FREE SYRINGE IM  ADMIN INFLUENZA VIRUS VAC     Reviewed hospital records and labs.     He has follow-up with endocrinology but he is a high probability for a no-show or eventually not staying engaged with them.  For this reason, I will manage his meds and monitor his A1c.  If he does see them and they change meds, we may have to let that co-manage.      He is advised that he is overdue for colonoscopy. He has been aware.      Refills of prilosec for ongoing GERD.      He is referred to our in-house hematologist so we can better monitor compliance.       I am unsure how to manage his chronic pain.  He has not been compliant with his pain management appointment and it is difficult to get him established somewhere.    Obesity - diet and exercise as tolerated, always reinforce this.      I have had several discussions with him about his poor compliance pattern.  I am unsure how to motivate him.      All chart history elements were reviewed by me at the time of the visit even though marked at time of note closure. Patient understands our medical plan. Patient has provided input and agrees with goals. Alternatives have been explained and offered.  All questions answered.  The patient is to call if condition worsens or fails to improve.     Follow-up Disposition:  Return in about 2 months (around 01/14/2016) for routine care.  A1c to be done in office.

## 2015-11-14 NOTE — Patient Instructions (Addendum)
Influenza (Flu) Vaccine (Inactivated or Recombinant): What You Need to Know  Why get vaccinated?  Influenza ("flu") is a contagious disease that spreads around the [place] every winter, usually between October and May.  Flu is caused by influenza viruses and is spread mainly by coughing, sneezing, and close contact.  Anyone can get flu. Flu strikes suddenly and can last several days. Symptoms vary by age, but can include:  ?? Fever/chills.  ?? Sore throat.  ?? Muscle aches.  ?? Fatigue.  ?? Cough.  ?? Headache.  ?? Runny or stuffy nose.  Flu can also lead to pneumonia and blood infections, and cause diarrhea and seizures in children. If you have a medical condition, such as heart or lung disease, flu can make it worse.  Flu is more dangerous for some people. Infants and young children, people 65 years of age and older, pregnant women, and people with certain health conditions or a weakened immune system are at greatest risk.  Each year thousands of people in the [place] die from flu, and many more are hospitalized.  Flu vaccine can:  ?? Keep you from getting flu.  ?? Make flu less severe if you do get it.  ?? Keep you from spreading flu to your family and other people.  Inactivated and recombinant flu vaccines  A dose of flu vaccine is recommended every flu season. Children 6 months through 8 years of age may need two doses during the same flu season. Everyone else needs only one dose each flu season.  Some inactivated flu vaccines contain a very small amount of a mercury-based preservative called thimerosal. Studies have not shown thimerosal in vaccines to be harmful, but flu vaccines that do not contain thimerosal are available.  There is no live flu virus in flu shots. They cannot cause the flu.  There are many flu viruses, and they are always changing. Each year a new flu vaccine is made to protect against three or four viruses that are likely to cause disease in the upcoming flu season. But even when the  vaccine doesn't exactly match these viruses, it may still provide some protection.  Flu vaccine cannot prevent:  ?? Flu that is caused by a virus not covered by the vaccine.  ?? Illnesses that look like flu but are not.  Some people should not get this vaccine  Tell the person who is giving you the vaccine:  ?? If you have any severe (life-threatening) allergies. If you ever had a life-threatening allergic reaction after a dose of flu vaccine, or have a severe allergy to any part of this vaccine, you may be advised not to get vaccinated. Most, but not all, types of flu vaccine contain a small amount of egg protein.  ?? If you ever had Guillain-Barr?? syndrome (also called GBS) Some people with a history of GBS should not get this vaccine. This should be discussed with your doctor.  ?? If you are not feeling well. It is usually okay to get flu vaccine when you have a mild illness, but you might be asked to come back when you feel better.  Risks of a vaccine reaction  With any medicine, including vaccines, there is a chance of reactions. These are usually mild and go away on their own, but serious reactions are also possible.  Most people who get a flu shot do not have any problems with it.  Minor problems following a flu shot include:  ?? Soreness, redness, or swelling   where the shot was given  ?? Hoarseness  ?? Sore, red or itchy eyes  ?? Cough  ?? Fever  ?? Aches  ?? Headache  ?? Itching  ?? Fatigue  If these problems occur, they usually begin soon after the shot and last 1 or 2 days.  More serious problems following a flu shot can include the following:  ?? There may be a small increased risk of Guillain-Barr?? Syndrome (GBS) after inactivated flu vaccine. This risk has been estimated at 1 or 2 additional cases per million people vaccinated. This is much lower than the risk of severe complications from flu, which can be prevented by flu vaccine.  ?? Young children who get the flu shot along with pneumococcal vaccine  (PCV13) and/or DTaP vaccine at the same time might be slightly more likely to have a seizure caused by fever. Ask your doctor for more information. Tell your doctor if a child who is getting flu vaccine has ever had a seizure  Problems that could happen after any injected vaccine:  ?? People sometimes faint after a medical procedure, including vaccination. Sitting or lying down for about 15 minutes can help prevent fainting, and injuries caused by a fall. Tell your doctor if you feel dizzy, or have vision changes or ringing in the ears.  ?? Some people get severe pain in the shoulder and have difficulty moving the arm where a shot was given. This happens very rarely.  ?? Any medication can cause a severe allergic reaction. Such reactions from a vaccine are very rare, estimated at about 1 in a million doses, and would happen within a few minutes to a few hours after the vaccination.  As with any medicine, there is a very remote chance of a vaccine causing a serious injury or death.  The safety of vaccines is always being monitored. For more information, visit: www.cdc.gov/vaccinesafety/.  What if there is a serious reaction?  What should I look for?  ?? Look for anything that concerns you, such as signs of a severe allergic reaction, very high fever, or unusual behavior.  Signs of a severe allergic reaction can include hives, swelling of the face and throat, difficulty breathing, a fast heartbeat, dizziness, and weakness ??? usually within a few minutes to a few hours after the vaccination.  What should I do?  ?? If you think it is a severe allergic reaction or other emergency that can't wait, call 9-1-1 and get the person to the nearest hospital. Otherwise, call your doctor.  ?? Reactions should be reported to the "Vaccine Adverse Event Reporting System" (VAERS). Your doctor should file this report, or you can do it yourself through the VAERS website at www.vaers.hhs.gov, or by calling 1-800-822-7967.   VAERS does not give medical advice.  The National Vaccine Injury Compensation Program  The National Vaccine Injury Compensation Program (VICP) is a federal program that was created to compensate people who may have been injured by certain vaccines.  Persons who believe they may have been injured by a vaccine can learn about the program and about filing a claim by calling 1-800-338-2382 or visiting the VICP website at www.hrsa.gov/vaccinecompensation. There is a time limit to file a claim for compensation.  How can I learn more?  ?? Ask your healthcare provider. He or she can give you the vaccine package insert or suggest other sources of information.  ?? Call your local or state health department.  ?? Contact the Centers for Disease Control and Prevention (CDC):  ??   Call (863) 170-89991-7063584562 (1-800-CDC-INFO) or  ?? Visit CDC's website at BiotechRoom.com.cywww.cdc.gov/flu  Vaccine Information Statement  Inactivated Influenza Vaccine  09/22/2013)  42 [place]. ?? [id number]  Department of Health and Insurance risk surveyorHuman Services  Centers for Disease Control and Prevention  Many Vaccine Information Statements are available in Spanish and other languages. See PromoAge.com.brwww.immunize.org/vis.  Muchas hojas de informaci??n sobre vacunas est??n disponibles en espa??ol y en otros idiomas. Visite PromoAge.com.brwww.immunize.org/vis.  Care instructions adapted under license by [employer] (which disclaims liability or warranty for this information). If you have questions about a medical condition or this instruction, always ask your healthcare professional. Healthwise[employer]orporated disclaims any warranty or liability for your use of this information.      [facility]  Address: [address], [place], [place] [address]  Phone: [phone]  Appointment: 12/13/15 @ 10:45 am with Dr. Lynnell CatalanLaRocque    Fill out paperwork in its entirety (will be mailed from office).  Please cancel appointment within 48 hours if unable to make appointment.      [facility]   [address]  [place], [place] [id number]  Phone [phone]  Appointment: 12/02/15 @ 3:30 pm with Dr. Dierdre SearlesLaura Salmon

## 2015-11-14 NOTE — Progress Notes (Signed)
Call placed to patient verified his appointment at 1315.  Patient stated that he had called the office today to verify the time.

## 2015-11-14 NOTE — Progress Notes (Signed)
1. Have you been to the ER, urgent care clinic since your last visit?  Hospitalized since your last visit?Yes When: 10-28-2015 to 11-01-2015 [facility] Reason for visit: DKA    2. Have you seen or consulted any other health care providers outside of the [facility] since your last visit?  Include any pap smears or colon screening. No     3. Endocrinologist appointment October 27,2017 at 10:45 am with Dr. Larry SierrasJames C. Larocque

## 2016-01-08 NOTE — Progress Notes (Addendum)
Nurse Navigator attempted to contact patient. Message left introducing myself, the purpose of the call and giving my contact information.  Requested that patient call back at his/her earliest convenience.  Patient called the office back.  [facility]  Sean AlmMichael A [name] age 55 y.o. was [facility] ED [facility]  [facility], 12/05/15 and 12/06/15 for Acute DVT right leg.     Presenting symptoms per ED note:   Right thigh pain    Pertinent labs:  Right: The deep venous system of the right lower extremity was examined using duplex ultrasound. ??B-mode imaging demonstrates decreased compressibility of the common femoral, femoral, and popliteal veins with hypoechoic thrombus identified----non occlusive in the common femoral, proximal femoral and occlusive in the mid to distal femoral and popliteal veins. Imaging of the deep femoral, posterior tibial and peroneal veins was suboptimal; with color Doppler these veins are grossly patent. Reflux evaluation is deferred due to acute deep venous thrombosis. The contralateral common femoral vein demonstrates decreased compressibility with hyperechoic thrombus identified with evidence of ??valvular reflux.  Conclusions: Acute deep venous thrombosis in the right common femoral, femoral and popliteal veins as described.  Technically compromised study therefore non-occlusive deep venous thrombosis cannot be excluded in the right deep femoral, posterior tibial and peroneal veins as described in the findings. ??  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  Venous valvular reflux in the left common femoral vein.  When compared to the previous exam performed on 04/29/2009 which demonstrated chronic non occlusive right femoral vein ??thrombosis only, ??the acute deep venous thrombosis in the right is a new finding.    New medications/changes to current medications taken from ED note:   fondaparinux (ARIXTRA) 10 mg/0.8 mL SC Syrg Inject 0.8 mL beneath the skin Every 12 hours for 9 days., Disp-14.4 mL, R-0, Normal       ED admissions in the past 6 months: 4 - 2 for this acute DVT 1 time in September for DKA that resulted in admission.  1 in June for back pain    Contacted patient for ED follow up.  Verified 2 patient identifiers. Introduced self, role and reason for call.      Patient reports:  Had thigh pain for a week before going to the ED. Patient is still having thigh pain and has numbness and tingling in his foot in the morning that resolves to a tight feeling as the day goes on and he moves.    Patient denies:  Problems with giving himself shots.  He states the needle is long but its ok  ADL's:  Preforms all ADL's  by self with out difficulty     DME:   none  Support:   No detectable resource needs at this time    Educated patient to monitor and report the following Red flags: coldness, worsening numbness and tingling in foot, chest pain or any new or concerning symptoms. Patient verbalized understanding of information discussed and is aware of  when to seek medical attention from PCP, urgent care or ED.  Reviewed new medications or changes to previous medications and allergies reviewed. Instructed to bring all medications or list of medications with him to next appointment. Opportunity to ask questions was provided. Contact information was provided for future reference or further questions.    Appointment(s):  01/13/2016 9:30 AM Amil AmenAnand Kapur, MD [facility]   Hematology appointment January 21, 2016( per patient but he could  not remember the name of doctor)  Patient aware. He will provide transportation.

## 2016-01-13 ENCOUNTER — Ambulatory Visit: Admit: 2016-01-13 | Discharge: 2016-01-13 | Payer: MEDICARE | Attending: Family Medicine

## 2016-01-13 DIAGNOSIS — IMO0002 Reserved for concepts with insufficient information to code with codable children: Secondary | ICD-10-CM

## 2016-01-13 LAB — AMB POC HEMOGLOBIN A1C: Hemoglobin A1c (POC): 6.9 %

## 2016-01-13 MED ORDER — ATORVASTATIN 40 MG TAB
40 mg | ORAL_TABLET | Freq: Every day | ORAL | 3 refills | Status: DC
Start: 2016-01-13 — End: 2016-09-22

## 2016-01-13 NOTE — Progress Notes (Signed)
1. Have you been to the ER, urgent care clinic since your last visit?  Hospitalized since your last visit?Yes [facility] for leg pain; dx with DVT    2. Have you seen or consulted any other health care providers outside of the [facility] since your last visit?  Include any pap smears or colon screening. No    01/17/16 appointment with Dr. Elnita MaxwellBhende (vascular surgeon)    Per patient he was given 1 dose of Lovenox in ED.  He is now on Arixtra 0.8 mg BID x 2 weeks.    Patient reports he is only taking Metformin 1,000 mg once daily not BID.

## 2016-01-13 NOTE — Progress Notes (Signed)
SUBJECTIVE  Chief Complaint   Patient presents with   ??? Diabetes   ??? Hospital Follow Up     [name] is a 55 y.o. year old male, he is seen today for follow-up.    Most recently he was in the ER due to increased leg pain from his DVT.  He has been [facility] with a vascular specialist.  He already sees a hematologist and says he has been compliant with his Arixtra.   He has no complaints today other than wanting a brace for chronic low back pain.  It is not radicular.  He says it is worse in the mornings when he first wakes up.  He says it has been unchanged for more than 10 years.  He had a brace in the past but it did not fit.     He has been checking his blood sugars which are in the 80-100 range fasting and as high as 128 after eating.  He has changed his diet a lot due to the diabetes.  He is taking Lantus 60U at night and Januvia.  He is on metformin once daily instead of twice daily as prescribed.      He has been largely noncompliant in various ways with follow-up and following treatment plans.  He has done better lately with diabetes.      OBJECTIVE    Blood pressure 120/88, pulse 74, temperature 98.6 ??F (37 ??C), temperature source Oral, resp. rate 16, height 6\' 5"  (1.956 m), weight 343 lb (155.6 kg), SpO2 95 %.  General:  Alert, cooperative, well appearing, in no apparent distress.  Eyes:  Pupils are equally round and reactive to light with accommodation.  The extra-ocular movements are intact.  The lids are without swelling, lesions, or drainage.  The conjunctiva are clear and noninjected.  ENT:    The tongue and mucous membranes are pink and moist without lesions.   Neck:  The thyroid is normal size without nodules or masses.  There is no lymphadenopathy.  There are no carotid bruits.  CV:  The heart sounds are regular in rate and rhythm.  There is a normal S1 and S2.   Lungs:  Inspiratory and expiratory efforts are full and unlabored.  Lung  sounds are clear and equal to auscultation throughout all lung fields without wheezing, rales, or rhonchi.  Skin:  No pedal rashes, no jaundice.  Psych: normal affect.  Mood good.  Oriented x 3.  Judgement and insight intact.     Results for orders placed or performed in visit on 01/13/16   AMB POC HEMOGLOBIN A1C   Result Value Ref Range    Hemoglobin A1c (POC) 6.9 %     ASSESSMENT / PLAN    ICD-10-CM ICD-9-CM    1. Uncontrolled type 2 diabetes mellitus with microalbuminuria, with long-term current use of insulin (HCC) E11.29 250.42 AMB POC HEMOGLOBIN A1C    E11.65 791.0     R80.9 V58.67     Z79.4     2. Hypercholesterolemia E78.00 272.0 atorvastatin (LIPITOR) 40 mg tablet   3. Essential hypertension I10 401.9    4. Deep vein thrombosis (DVT) of proximal lower extremity, unspecified chronicity, unspecified laterality (HCC) I82.4Y9 453.41    5. Class 3 obesity due to excess calories with serious comorbidity and body mass index (BMI) of 40.0 to 44.9 in adult (HCC) E66.09 278.00     Z68.41 V85.41    6. Gastroesophageal reflux disease, esophagitis presence  not specified K21.9 530.81    7. Chronic midline low back pain without sciatica M54.5 724.2     G89.29 338.29    8. Medically noncompliant Z91.19 V15.81      DM with microalbuminuria - cont current care with the endocrinologist.  Cont ACE inhibitor.   Cont current meds.  Motivate to have eyes checked annually.      Hypercholesterolemia -  Advised to cont statin.  Due for fasting labs in Jan.     HTN - controlled.  Cont current meds.  DASH dieting.    DVT - cont per hematology.      Obesity - exercise is limited.  Cont dieting.    Low back pain - brace ordered per his request.     GERD - He is advised that he is overdue for colonoscopy. He has been aware.  Refills of prilosec for ongoing GERD should better be managed by GI.      Medical noncompliance - doing better somewhat.  I have had several discussions with him about his poor compliance pattern in the past.      All chart history elements were reviewed by me at the time of the visit even though marked at time of note closure. Patient understands our medical plan. Patient has provided input and agrees with goals. Alternatives have been explained and offered.  All questions answered.  The patient is to call if condition worsens or fails to improve.     Follow-up Disposition:  Return mid-January 2018 for routine care.  Fasting labs due 3-7 days prior to appointment.

## 2016-01-13 NOTE — Progress Notes (Addendum)
Patient's last OV with Dr. Arnette FeltsFranzman (hem/onc) was 12/06/14.  He no-showed 12/02/15 with Dr. Carmie KannerSalmon. Confirmed with Darl PikesSusan that patient has no upcoming appointments at [facility].  Patient's last OV with Dr. Eulogio Ditchamle (hem/onc) was 01/07/15.  He missed 03/11/15 follow up appointment.  He called back to office in August 2017 to reschedule the missed 03/11/15 appointment.  He then canceled 11/05/15 appointment and has not rescheduled.

## 2016-01-23 NOTE — Telephone Encounter (Signed)
Ron from [facility] is calling for dr notes and medical records to determine  why pt need need back brace. Ron will be faxing over Medical release form for medical records    Ron's number is [phone]. Ext [phone]

## 2016-01-23 NOTE — Telephone Encounter (Signed)
Fax received from [facility].  Pending physician review and update.

## 2016-06-16 DEATH — deceased

## 2016-07-21 NOTE — Telephone Encounter (Addendum)
Spoke with Sean [name] in reference to request received from [facility] for diabetic supplies and to schedule follow up/fasting labs. Per Sean [name] he is good with diabetic testing supplies and he don't think this is the company he is using. When ask can I get him scheduled for his routine follow up/labs? "His reply was I guess if I need one and then it became let me call you back". I stress  with Sean [name] that it is very important that he schedule an appointment with [name] and  that he was due in January of 2018 and last medication requested back in September/November of 2017. "He stated he will call back to schedule appointment"

## 2016-07-24 NOTE — Telephone Encounter (Signed)
No appointment on file as of 07-24-2016 encounter closed

## 2016-09-22 ENCOUNTER — Ambulatory Visit: Admit: 2016-09-22 | Discharge: 2016-09-22 | Payer: MEDICARE | Attending: Family Medicine

## 2016-09-22 DIAGNOSIS — E118 Type 2 diabetes mellitus with unspecified complications: Secondary | ICD-10-CM

## 2016-09-22 DIAGNOSIS — Z0000 Encounter for general adult medical examination without abnormal findings: Secondary | ICD-10-CM

## 2016-09-22 LAB — AMB POC URINE, MICROALBUMIN, SEMIQUANT (3 RESULTS)
ALBUMIN, URINE POC: 10 mg/L
CREATININE, URINE POC: 10 mg/dL

## 2016-09-22 LAB — AMB POC HEMOGLOBIN A1C: Hemoglobin A1c (POC): 6.3 %

## 2016-09-22 MED ORDER — ATORVASTATIN 40 MG TAB
40 mg | ORAL_TABLET | Freq: Every day | ORAL | 5 refills | Status: DC
Start: 2016-09-22 — End: 2017-03-29

## 2016-09-22 MED ORDER — OMEPRAZOLE 20 MG CAP, DELAYED RELEASE
20 mg | ORAL_CAPSULE | Freq: Every day | ORAL | 5 refills | Status: DC
Start: 2016-09-22 — End: 2017-03-29

## 2016-09-22 MED ORDER — AMLODIPINE-BENAZEPRIL 10 MG-40 MG CAP
10-40 mg | ORAL_CAPSULE | Freq: Every day | ORAL | 5 refills | Status: DC
Start: 2016-09-22 — End: 2017-03-29

## 2016-09-22 MED ORDER — ATENOLOL 100 MG TAB
100 mg | ORAL_TABLET | Freq: Every day | ORAL | 5 refills | Status: DC
Start: 2016-09-22 — End: 2017-03-29

## 2016-09-22 NOTE — ACP (Advance Care Planning) (Signed)
Advance Care Planning    Advance Care Planning  ??  Advance Care Planning (ACP) Provider Note - Comprehensive   ??  Date of ACP Conversation: 09/22/2016  Persons included in Conversation:  patient  Length of ACP Conversation in minutes:  16 minutes  ??  Authorized Management consultantDecision Maker (if patient is incapable of making informed decisions):   This person is:  not identified but he lives with his sister currently  ??  ????  General ACP for ALL Patients with Decision Making Capacity:   Importance of advance care planning, including choosing a healthcare agent to communicate patient's healthcare decisions if patient lost the ability to make decisions, such as after a sudden illness or accident  Understanding of the healthcare agent role was assessed and information provided  ??  Review of Existing Advance Directive:  none exists  ??  For Serious or Chronic Illness:  Understanding of medical condition    ??  Interventions Provided:  Recommended completion of Advance Directive form after review of ACP materials and conversation with prospective healthcare agent   Recommended review of completed ACP document annually or upon change in health status  ??

## 2016-09-22 NOTE — ACP (Advance Care Planning) (Signed)
Advance Care Planning    Advance Care Planning  ??  Advance Care Planning (ACP) Provider Note - Comprehensive   ??  Date of ACP Conversation: 09/22/2016  Persons included in Conversation:  patient  Length of ACP Conversation in minutes:  16 minutes  ??  Authorized Decision Maker (if patient is incapable of making informed decisions):   This person is:  not identified but he lives with his sister currently  ??  ????  General ACP for ALL Patients with Decision Making Capacity:   Importance of advance care planning, including choosing a healthcare agent to communicate patient's healthcare decisions if patient lost the ability to make decisions, such as after a sudden illness or accident  Understanding of the healthcare agent role was assessed and information provided  ??  Review of Existing Advance Directive:  none exists  ??  For Serious or Chronic Illness:  Understanding of medical condition    ??  Interventions Provided:  Recommended completion of Advance Directive form after review of ACP materials and conversation with prospective healthcare agent   Recommended review of completed ACP document annually or upon change in health status  ??
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
## 2016-09-22 NOTE — Patient Instructions (Signed)
Medicare Wellness Visit, Male    The best way to live healthy is to have a lifestyle where you eat a well-balanced diet, exercise regularly, limit alcohol use, and quit all forms of tobacco/nicotine, if applicable.   Regular preventive services are another way to keep healthy. Preventive services (vaccines, screening tests, monitoring & exams) can help personalize your care plan, which helps you manage your own care. Screening tests can find health problems at the earliest stages, when they are easiest to treat.     [facility] follows the current, evidence-based guidelines published by the [place] [employer]ventive Services Task Force (USPSTF) when recommending preventive services for our patients. Because we follow these guidelines, sometimes recommendations change over time as research supports it. (For example, a prostate screening blood test is no longer routinely recommended for men with no symptoms.)    Of course, you and your provider may decide to screen more often for some diseases, based on your risk and co-morbidities (chronic disease you are already diagnosed with).     Preventive services for you include:    - Medicare offers their members a free annual wellness visit, which is time for you and your primary care provider to discuss and plan for your preventive service needs. Take advantage of this benefit every year!    -All people over age 56 should receive the recommended pneumonia vaccines. Current USPSTF guidelines recommend a series of two vaccines for the best pneumonia protection.     -All adults should have a yearly flu vaccine and a tetanus vaccine every 10 years. All adults age 56 years should receive a shingles vaccine once in their lifetime.      -All adults age 56-70 years who are overweight should have a diabetes screening test once every three years.     -Other screening tests & preventive services for persons with diabetes  include: an eye exam to screen for diabetic retinopathy, a kidney function test, a foot exam, and stricter control over your cholesterol.     -Cardiovascular screening for adults with routine risk involves an electrocardiogram (ECG) at intervals determined by the provider.     -Colorectal cancer screenings should be done for adults age 56-75 years with normal risk. There are a number of acceptable methods of screening for this type of cancer. Each test has its own benefits and drawbacks. Discuss with your provider what is most appropriate for you during your annual wellness visit. The different tests include: colonoscopy (considered the best screening method), a fecal occult blood test, a fecal DNA test, and sigmoidoscopy.    -All adults born between 1945 and 1965 should be screened once for Hepatitis C.    -An Abdominal Aortic Aneurysm (AAA) Screening is recommended for men age 56-75 who has ever smoked in their lifetime.     Here is a list of your current Health Maintenance items (your personalized list of preventive services) with a due date:  Health Maintenance Due   Topic Date Due   ??? Colonoscopy  11/17/2012   ??? Cholesterol Test   12/13/2015   ??? Annual Well Visit  05/30/2016   ??? Diabetic Foot Care  07/10/2016   ??? Albumin Urine Test  07/10/2016   ??? Hemoglobin A1C    07/12/2016   ??? Flu Vaccine  09/16/2016   ??? Eye Exam  09/22/2016
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
## 2016-09-22 NOTE — Progress Notes (Signed)
Chief Complaint   Patient presents with   ??? Annual Wellness Visit     PHQ over the last two weeks 09/22/2016   Little interest or pleasure in doing things Not at all   Feeling down, depressed, irritable, or hopeless Not at all   Total Score PHQ 2 0   Trouble falling or staying asleep, or sleeping too much -   Feeling tired or having little energy -   Poor appetite, weight loss, or overeating -   Feeling bad about yourself - or that you are a failure or have let yourself or your family down -   Trouble concentrating on things such as school, work, reading, or watching TV -   Moving or speaking so slowly that other people could have noticed; or the opposite being so fidgety that others notice -   Thoughts of being better off dead, or hurting yourself in some way -   PHQ 9 Score -   How difficult have these problems made it for you to do your work, take care of your home and get along with others -     Abuse Screening Questionnaire 09/22/2016   Do you ever feel afraid of your partner? N   Are you in a relationship with someone who physically or mentally threatens you? N   Is it safe for you to go home? Y     Fall Risk Assessment, last 12 mths 09/22/2016   Able to walk? Yes   Fall in past 12 months? No   Fall with injury? -   Number of falls in past 12 months -   Fall Risk Score -     1. Have you been to the ER, urgent care clinic since your last visit?  Hospitalized since your last visit?Yes [facility]    2. Have you seen or consulted any other health care providers outside of the [facility] since your last visit?  Include any pap smears or colon screening. No

## 2016-09-22 NOTE — Addendum Note (Signed)
Addended by: Fenton FoyMOORE, Korianna Washer on: 03/30/2017 07:14 AM     Modules accepted: Orders

## 2016-09-22 NOTE — Patient Instructions (Addendum)
A Healthy Lifestyle: Care Instructions  Your Care Instructions    A healthy lifestyle can help you feel good, stay at a healthy weight, and have plenty of energy for both work and play. A healthy lifestyle is something you can share with your whole family.  A healthy lifestyle also can lower your risk for serious health problems, such as high blood pressure, heart disease, and diabetes.  You can follow a few steps listed below to improve your health and the health of your family.  Follow-up care is a key part of your treatment and safety. Be sure to make and go to all appointments, and call your doctor if you are having problems. It's also a good idea to know your test results and keep a list of the medicines you take.  How can you care for yourself at home?  ?? Do not eat too much sugar, fat, or fast foods. You can still have dessert and treats now and then. The goal is moderation.  ?? Start small to improve your eating habits. Pay attention to portion sizes, drink less juice and soda pop, and eat more fruits and vegetables.  ?? Eat a healthy amount of food. A 3-ounce serving of meat, for example, is about the size of a deck of cards. Fill the rest of your plate with vegetables and whole grains.  ?? Limit the amount of soda and sports drinks you have every day. Drink more water when you are thirsty.  ?? Eat at least 5 servings of fruits and vegetables every day. It may seem like a lot, but it is not hard to reach this goal. A serving or helping is 1 piece of fruit, 1 cup of vegetables, or 2 cups of leafy, raw vegetables. Have an apple or some carrot sticks as an afternoon snack instead of a candy bar. Try to have fruits and/or vegetables at every meal.  ?? Make exercise part of your daily routine. You may want to start with simple activities, such as walking, bicycling, or slow swimming. Try to be active 30 to 60 minutes every day. You do not need to do all 30 to 60  minutes all at once. For example, you can exercise 3 times a day for 10 or 20 minutes. Moderate exercise is safe for most people, but it is always a good idea to talk to your doctor before starting an exercise program.  ?? Keep moving. Mow the lawn, work in the garden, or clean your house. Take the stairs instead of the elevator at work.  ?? If you smoke, quit. People who smoke have an increased risk for heart attack, stroke, cancer, and other lung illnesses. Quitting is hard, but there are ways to boost your chance of quitting tobacco for good.  ?? Use nicotine gum, patches, or lozenges.  ?? Ask your doctor about stop-smoking programs and medicines.  ?? Keep trying.  In addition to reducing your risk of diseases in the future, you will notice some benefits soon after you stop using tobacco. If you have shortness of breath or asthma symptoms, they will likely get better within a few weeks after you quit.  ?? Limit how much alcohol you drink. Moderate amounts of alcohol (up to 2 drinks a day for men, 1 drink a day for women) are okay. But drinking too much can lead to liver problems, high blood pressure, and other health problems.  Family health  If you have a family, there are many things you   can do together to improve your health.  ?? Eat meals together as a family as often as possible.  ?? Eat healthy foods. This includes fruits, vegetables, lean meats and dairy, and whole grains.  ?? Include your family in your fitness plan. Most people think of activities such as jogging or tennis as the way to fitness, but there are many ways you and your family can be more active. Anything that makes you breathe hard and gets your heart pumping is exercise. Here are some tips:  ?? Walk to do errands or to take your child to school or the bus.  ?? Go for a family bike ride after dinner instead of watching TV.  Where can you learn more?  Go to http://www.healthwise.net/GoodHelpConnections.   Enter U807 in the search box to learn more about "A Healthy Lifestyle: Care Instructions."  Current as of: January 23, 2016  Content Version: 11.7  ?? 2006-2018 [employer]. Care instructions adapted under license by [employer] (which disclaims liability or warranty for this information). If you have questions about a medical condition or this instruction, always ask your healthcare professional. Healthwise, Incorporated disclaims any warranty or liability for your use of this information.

## 2016-11-19 NOTE — Telephone Encounter (Signed)
Spoke with Sean [name].  Patient has been scheduled for 12/28/16.  Pre op testing to be done prior.

## 2016-11-19 NOTE — Telephone Encounter (Signed)
[name] Juliene Pina[facility]offrey Wright's office is requesting a pre op appt for pt to have a Rt Knee scope with Meniscus repair on 01/06/2017. Please call [name] at [phone] opt [phone] to schedule. Thank you

## 2016-12-28 ENCOUNTER — Ambulatory Visit: Attending: Family Medicine

## 2016-12-28 ENCOUNTER — Ambulatory Visit: Admit: 2016-12-28 | Discharge: 2016-12-28 | Payer: MEDICARE | Attending: Family Medicine

## 2016-12-28 DIAGNOSIS — S83241D Other tear of medial meniscus, current injury, right knee, subsequent encounter: Secondary | ICD-10-CM

## 2016-12-28 NOTE — Patient Instructions (Addendum)
A Healthy Lifestyle: Care Instructions  Your Care Instructions    A healthy lifestyle can help you feel good, stay at a healthy weight, and have plenty of energy for both work and play. A healthy lifestyle is something you can share with your whole family.  A healthy lifestyle also can lower your risk for serious health problems, such as high blood pressure, heart disease, and diabetes.  You can follow a few steps listed below to improve your health and the health of your family.  Follow-up care is a key part of your treatment and safety. Be sure to make and go to all appointments, and call your doctor if you are having problems. It's also a good idea to know your test results and keep a list of the medicines you take.  How can you care for yourself at home?  ?? Do not eat too much sugar, fat, or fast foods. You can still have dessert and treats now and then. The goal is moderation.  ?? Start small to improve your eating habits. Pay attention to portion sizes, drink less juice and soda pop, and eat more fruits and vegetables.  ? Eat a healthy amount of food. A 3-ounce serving of meat, for example, is about the size of a deck of cards. Fill the rest of your plate with vegetables and whole grains.  ? Limit the amount of soda and sports drinks you have every day. Drink more water when you are thirsty.  ? Eat at least 5 servings of fruits and vegetables every day. It may seem like a lot, but it is not hard to reach this goal. A serving or helping is 1 piece of fruit, 1 cup of vegetables, or 2 cups of leafy, raw vegetables. Have an apple or some carrot sticks as an afternoon snack instead of a candy bar. Try to have fruits and/or vegetables at every meal.  ?? Make exercise part of your daily routine. You may want to start with simple activities, such as walking, bicycling, or slow swimming. Try to be active 30 to 60 minutes every day. You do not need to do all 30 to 60  minutes all at once. For example, you can exercise 3 times a day for 10 or 20 minutes. Moderate exercise is safe for most people, but it is always a good idea to talk to your doctor before starting an exercise program.  ?? Keep moving. Mow the lawn, work in the garden, or clean your house. Take the stairs instead of the elevator at work.  ?? If you smoke, quit. People who smoke have an increased risk for heart attack, stroke, cancer, and other lung illnesses. Quitting is hard, but there are ways to boost your chance of quitting tobacco for good.  ? Use nicotine gum, patches, or lozenges.  ? Ask your doctor about stop-smoking programs and medicines.  ? Keep trying.  In addition to reducing your risk of diseases in the future, you will notice some benefits soon after you stop using tobacco. If you have shortness of breath or asthma symptoms, they will likely get better within a few weeks after you quit.  ?? Limit how much alcohol you drink. Moderate amounts of alcohol (up to 2 drinks a day for men, 1 drink a day for women) are okay. But drinking too much can lead to liver problems, high blood pressure, and other health problems.  Family health  If you have a family, there are many things you   can do together to improve your health.  ?? Eat meals together as a family as often as possible.  ?? Eat healthy foods. This includes fruits, vegetables, lean meats and dairy, and whole grains.  ?? Include your family in your fitness plan. Most people think of activities such as jogging or tennis as the way to fitness, but there are many ways you and your family can be more active. Anything that makes you breathe hard and gets your heart pumping is exercise. Here are some tips:  ? Walk to do errands or to take your child to school or the bus.  ? Go for a family bike ride after dinner instead of watching TV.  Where can you learn more?  Go to InsuranceStats.cahttp://www.healthwise.net/GoodHelpConnections.   Enter [phone] in the search box to learn more about "A Healthy Lifestyle: Care Instructions."  Current as of: January 23, 2016  Content Version: 11.8  ?? 2006-2018 [employer]wise, [employer]. Care instructions adapted under license by [employer] (which disclaims liability or warranty for this information). If you have questions about a medical condition or this instruction, always ask your healthcare professional. Healthwise, Incorporated disclaims any warranty or liability for your use of this information.    Appointment made for patient to see:    Doctor: Dr. Allena KatzPatel     Specialty:Cardiology    Address/telephone #:[address]     Appointment date/time: 12-29-2016 at 9:45 amd arrive at 9:30 am for paper work    Diagnosis:Pre-op clearance, palpitations and abnormal EKG    Patient notified of appointment information. Yes      Follow up in 6 months for routine care

## 2016-12-28 NOTE — Progress Notes (Signed)
Chief Complaint   Patient presents with   ??? Pre-op Exam     right knee scope with medial mensicus repair (Dr. Orene Desanctis @ [place] on 01-04-2017)   ??? Immunization/Injection     Preoperative Evaluation    Date of Exam: 12/28/2016    Sean [name] is a 56 y.o. male (DOB:1960-12-08) who presents for preoperative evaluation.     Latex Allergy: no    Medical History:     Past Medical History:   Diagnosis Date   ??? Cyst of left kidney     referred to urology   ??? DVT (deep venous thrombosis) ([place])    ??? GERD (gastroesophageal reflux disease)    ??? 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 H/O echocardiogram 10/2015    [facility] - unchanged from 2014, EF 55%, LVH, mild aortic regurge, mild diastolic dysfunction   ??? H/O esophagogastroduodenoscopy 11/16/11    Dr. Sula Rumple; normal stomach and duodenum; hiatal hernia in the cardia   ??? High cholesterol    ??? History of stress test 06/2012    [facility] - negative stress ECHO   ??? Hypertension    ??? Prediabetes    ??? S/P colonoscopy with polypectomy 11/16/11    Dr. Sula Rumple; polyps x 3; otherwise normal colonoscopy; f/u with gastro in 1 year   ??? Thromboembolus ([place])     last one 5 to 6 months ago   ??? Unspecified sleep apnea     no CPAP     Allergies:   No Known Allergies   Medications:     Current Outpatient Medications   Medication Sig   ??? atenolol (TENORMIN) 100 mg tablet Take 1 Tab by mouth daily.   ??? amLODIPine-benazepril (LOTREL) 10-40 mg per capsule Take 1 Cap by mouth daily.   ??? atorvastatin (LIPITOR) 40 mg tablet Take 1 Tab by mouth daily.   ??? omeprazole (PRILOSEC) 20 mg capsule Take 1 Cap by mouth daily.   ??? INSULIN GLARGINE,HUM.REC.ANLOG (LANTUS SC) 60 Units by SubCUTAneous route nightly.   ??? fondaparinux (ARIXTRA) 10 mg/0.8 mL syrg 0.8 mg by SubCUTAneous route two (2) times a day.   ??? SITagliptin (JANUVIA) 100 mg tablet Take 100 mg by mouth daily.   ??? metFORMIN (GLUCOPHAGE) 1,000 mg tablet Take 1 Tab by mouth two (2) times  daily (with meals). (Patient taking differently: Take 1,000 mg by mouth daily.)   ??? glucose (TRUEPLUS GLUCOSE) 4 gram chewable tablet Take 15 g by mouth. Take 1-5 Tabs by Mouth Take As Needed (Treatment initiated when BG LESS THAN 85m/dl) for up to 30 days.   ??? glucose blood VI test strips (ASCENSIA AUTODISC VI, ONE TOUCH ULTRA TEST VI) strip Please specify Brand One Touch Ultra 2 or One Touch UltraMini blood glucose meters).  ICD 10 code : E11 Type 2 Diabetes  Is patient on insulin: Yes Z279.4 Long Term (current) Insulin Use   ??? HYDROcodone-acetaminophen (NORCO) 5-325 mg per tablet Take 1 Tab by mouth as needed.   ??? glucose blood VI test strips (ASCENSIA AUTODISC VI, ONE TOUCH ULTRA TEST VI) strip Check fasting and 1 hour after eating as needed   ??? Blood-Glucose Meter monitoring kit Use as directed (Patient taking differently: One Touch Ultra 2 DX: E11.9)   ??? Lancets misc Use as directed (Patient taking differently: One Touch Delica Lancets EZ61.0Type 2 Diabetes)     No current facility-administered medications for this visit.      Surgical History:  Past Surgical History:   Procedure Laterality Date   ??? ABDOMEN SURGERY PROC UNLISTED      ulcer in stomach removed   ??? HX OTHER SURGICAL  3 years ago    [employer], inserted and removed     Social History:     Social History     Socioeconomic History   ??? Marital status: SINGLE     Spouse name: Not on file   ??? Number of children: Not on file   ??? Years of education: Not on file   ??? Highest education level: Not on file   Social Needs   ??? Financial resource strain: Not on file   ??? Food insecurity - worry: Not on file   ??? Food insecurity - inability: Not on file   ??? Transportation needs - medical: Not on file   ??? Transportation needs - non-medical: Not on file   Occupational History   ??? Occupation: Disable   Tobacco Use   ??? Smoking status: Current Every Day Smoker     Packs/day: 0.80     Years: 15.00     Pack years: 12.00     Types: Cigarettes    ??? Smokeless tobacco: Never Used   Substance and Sexual Activity   ??? Alcohol use: No   ??? Drug use: No   ??? Sexual activity: Yes     Partners: Female   Other Topics Concern   ??? Not on file   Social History Narrative   ??? 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 No dyspnea or chest pain on exertion. Has had 4-5 months of palpitations that he says last only a few minutes.  He has no assiated dyspnea or presyncope or lightheadedness.  No abdominal pain, change in bowel habits, black or bloody stools. No urinary tract or prostatic symptoms. No neurological complaints.    OBJECTIVE:   The patient appears well, alert, oriented x 3, in no distress.   Visit Vitals  BP 136/80 (BP 1 Location: Right arm, BP Patient Position: Sitting)   Pulse 82   Temp 98.8 ??F (37.1 ??C) (Oral)   Resp 16   Ht '6\' 5"'  (1.956 m)   Wt 332 lb (150.6 kg)   SpO2 98%   BMI 39.37 kg/m??     ENT normal.  Neck supple. No adenopathy or thyromegaly. PERLA.   Lungs are clear, good air entry, no wheezes, rhonchi or rales.   Cardiovascular:S1 and S2 normal, no murmurs, regular rate and rhythm.   Abdomen is soft without tenderness, guarding, mass or organomegaly.   Extremities show no edema, normal peripheral pulses.   Neurological is normal without focal findings.     DIAGNOSTICS:   1. EKG: EKG FINDINGS - no acute changes.  NSR, Prolonged PR interval.  No q waves.  2. CXR: was negative for infiltrate, effusion, pneumothorax, or wide mediastinum  3. Labs:     Results for orders placed or performed in visit on 09/22/16   AMB POC HEMOGLOBIN A1C   Result Value Ref Range    Hemoglobin A1c (POC) 6.3 %   AMB POC URINE, MICROALBUMIN, SEMIQUANT (3 RESULTS)   Result Value Ref Range    ALBUMIN, URINE  POC 10 Negative mg/L    CREATININE, URINE POC 10 mg/dL     Microalbumin/creat ratio (POC) 30-300 <30 MG/G     IMPRESSION:     ICD-10-CM ICD-9-CM    1. Other tear of medial meniscus of right knee as current injury, subsequent encounter S83.241D V58.89      836.0    2. Preoperative general physical examination Z01.818 V72.83    3. Preoperative clearance Z01.818 V72.84    4. Palpitations R00.2 785.1 REFERRAL TO CARDIOLOGY   5. Abnormal EKG R94.31 794.31 REFERRAL TO CARDIOLOGY   6. Hematuria, unspecified type R31.9 599.70    7. Controlled diabetes mellitus type 2 with complications, unspecified whether long term insulin use (HCC) E11.8 250.90    8. Essential hypertension I10 401.9    9. Hypercholesterolemia E78.00 272.0    10. Obesity, morbid ([place]) E66.01 278.01    11. Deep vein thrombosis (DVT) of proximal lower extremity, unspecified chronicity, unspecified laterality (HCC) I82.4Y9 453.41    12. Encounter for immunization Z23 V03.89 INFLUENZA VIRUS VAC QUAD,SPLIT,PRESV FREE SYRINGE IM     Given this is an elective procedure, I feel that it is best to have his heart checked out prior.  He has an abnormal EKG and occasionally has symptoms suggestive of a possible arrhyhtmia.  He has no acute or active symptoms in the office.    Hematuria - counseled on possible etiologies and the work-up indicated.  He wants to hold off and declines.   He will be moving from the area as well soon.    DM2- latest A1c is suggesting control as is his latest BMP.    HTN - controlled.  Cont current care.    Obesity - healthy eating.     DVT - chronic, suggested clearance and advice in the peri-operative period with respect to medication management    Surgery should be delayed until evaluated by cardiology.     Flu vaccine administered.     Truitt Leep, MD   12/28/2016

## 2016-12-28 NOTE — Progress Notes (Signed)
Flulaval 0.5 ml given IM in right deltoid. Lot # [id number], exp date 08/15/2017. Patient tolerated injection well. No adverse reaction noted.

## 2016-12-28 NOTE — Progress Notes (Signed)
1. Have you been to the ER, urgent care clinic since your last visit?  Hospitalized since your last visit?Yes [facility] on [facility]-2016,10-14-2016, and 11-28-2016 right knee pain    2. Have you seen or consulted any other health care providers outside of the [name] since your last visit?  Include any pap smears or colon screening. Yes When: 09-23-2016 Where: Podiatry (Dr. Wende NeighborsJeremy Walters)  Reason for visit: edema of left foot/bone contusion and on 10-07-2016,10-28-2016, and 11-18-2016 saw Ortho (Dr. Caryl AdaGeoffrey Wright) for right knee pain       Dr. Oletta LamasMichael Keverline (Opthalmology) has rescheduled appointments on 10-04-2016, 10-27-2016 and no-showed 11-06-2016    Dr Caroleen HammanAnant Damle (Gastroenterology) spoke with Corrie DandyMary, has rescheduled appointments on 05-21-2014, 05-31-2014 and 06-22-2014. [facility] mailed recall letter out on 10-23-2015.    Dr. Arnette FeltsFranzman (Hematology/[place]) spoke with Chrystie Noselover, last office visit no-showed 12-02-2015    [facility], seen only one time back in 2016    Jerral Ralphana Stallings,NP (Endocrinology) spoke with Danelle EarthlyNoel, last office visit 01-16-2016 and no-showed appointment on 01-20-2016

## 2016-12-29 ENCOUNTER — Ambulatory Visit: Admit: 2016-12-29 | Discharge: 2016-12-29 | Payer: MEDICARE | Attending: Specialist

## 2016-12-29 DIAGNOSIS — Z01810 Encounter for preprocedural cardiovascular examination: Secondary | ICD-10-CM
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
## 2016-12-29 NOTE — Patient Instructions (Signed)
MyChart Activation    Thank you for requesting access to MyChart. Please follow the instructions below to securely access and download your online medical record. MyChart allows you to send messages to your doctor, view your test results, renew your prescriptions, schedule appointments, and more.    How Do I Sign Up?    1. In your internet browser, go to [email or web address].  2. Click on the First Time User? Click Here link in the Sign In box. You will see the New Member Sign Up page.  3. Enter your MyChart Access Code exactly as it appears below. You will not need to use this code after you???ve completed the sign-up process. If you do not sign up before the expiration date, you must request a new code.    MyChart Access Code: [email or web address]  Expires: 03/28/2017 11:50 AM (This is the date your MyChart access code will expire)    4. Enter the last four digits of your Social Security Number (xxxx) and Date of Birth (mm/dd/yyyy) as indicated and click Submit. You will be taken to the next sign-up page.  5. Create a MyChart ID. This will be your MyChart login ID and cannot be changed, so think of one that is secure and easy to remember.  6. Create a MyChart password. You can change your password at any time.  7. Enter your Password Reset Question and Answer. This can be used at a later time if you forget your password.   8. Enter your e-mail address. You will receive e-mail notification when new information is available in MyChart.  9. Click Sign Up. You can now view and download portions of your medical record.  10. Click the Download Summary menu link to download a portable copy of your medical information.    Additional Information    If you have questions, please visit the Frequently Asked Questions section of the MyChart website at [email or web address] Remember, MyChart is NOT to be used for urgent needs. For medical emergencies, dial 911.

## 2016-12-30 ENCOUNTER — Inpatient Hospital Stay: Admit: 2016-12-30 | Payer: MEDICARE

## 2016-12-30 ENCOUNTER — Ambulatory Visit: Admit: 2016-12-30 | Discharge: 2016-12-30 | Payer: MEDICARE | Attending: Hematology & Oncology

## 2016-12-30 ENCOUNTER — Inpatient Hospital Stay: Admit: 2016-12-30

## 2016-12-30 DIAGNOSIS — I82403 Acute embolism and thrombosis of unspecified deep veins of lower extremity, bilateral: Secondary | ICD-10-CM

## 2016-12-30 LAB — CBC WITH 3 PART DIFF
ABS. LYMPHOCYTES: 2.3 10*3/uL (ref 1.1–5.9)
ABS. MIXED CELLS: 0.5 10*3/uL (ref 0.0–2.3)
ABS. NEUTROPHILS: 4.6 10*3/uL (ref 1.8–9.5)
HCT: 50.9 % — CR (ref 36–48)
HGB: 16.3 g/dL — CR (ref 12.0–16.0)
LYMPHOCYTES: 32 % (ref 14–44)
MCH: 26.1 PG (ref 25.0–35.0)
MCHC: 32 g/dL (ref 31–37)
MCV: 81.6 FL (ref 78–102)
Mixed cells: 7 % (ref 0.1–17)
NEUTROPHILS: 62 % (ref 40–70)
PLATELET: 246 10*3/uL (ref 140–440)
RBC: 6.24 M/uL — ABNORMAL HIGH (ref 4.10–5.10)
RDW: 15.5 % — ABNORMAL HIGH (ref 11.5–14.5)
WBC: 7.4 10*3/uL (ref 4.5–13.0)

## 2016-12-30 MED ORDER — ENOXAPARIN 150 MG/ML SUB-Q SYRINGE
150 mg/mL | INJECTION | Freq: Every day | SUBCUTANEOUS | 1 refills | Status: DC
Start: 2016-12-30 — End: 2017-04-05

## 2016-12-30 NOTE — Patient Instructions (Signed)
Deep Vein Thrombosis: Care Instructions  Your Care Instructions    A deep vein thrombosis (DVT) is a blood clot in certain veins of the legs, pelvis, or arms. Blood clots in these veins need to be treated because they can get bigger, break loose, and travel through the bloodstream to the lungs. A blood clot in a lung can be life-threatening.  The doctor may have given you a blood thinner (anticoagulant). A blood thinner can stop the blood clot from growing larger and prevent new clots from forming. You will need to take a blood thinner for 3 to 6 months or longer.  The doctor has checked you carefully, but problems can develop later. If you notice any problems or new symptoms, get medical treatment right away.  Follow-up care is a key part of your treatment and safety. Be sure to make and go to all appointments, and call your doctor if you are having problems. It's also a good idea to know your test results and keep a list of the medicines you take.  How can you care for yourself at home?  ?? Take your medicines exactly as prescribed. Call your doctor if you think you are having a problem with your medicine.  ?? If you are taking a blood thinner, be sure you get instructions about how to take your medicine safely. Blood thinners can cause serious bleeding problems.  ?? Wear compression stockings if your doctor recommends them. These stockings are tighter at the feet than on the legs. They may reduce pain and swelling in your legs. But there are different types of stockings, and they need to fit right. So your doctor will recommend what you need.  ?? When you sit, use a pillow to raise the arm or leg that has the blood clot. Try to keep it above the level of your heart.  When should you call for help?  Call 911 anytime you think you may need emergency care. For example, call if:  ?? ?? You passed out (lost consciousness).   ?? ?? You have symptoms of a blood clot in your lung (called a pulmonary  embolism). These include:  ? Sudden chest pain.  ? Trouble breathing.  ? Coughing up blood.   ??Call your doctor now or seek immediate medical care if:  ?? ?? You have new or worse trouble breathing.   ?? ?? You are dizzy or lightheaded, or you feel like you may faint.   ?? ?? You have symptoms of a blood clot in your arm or leg. These may include:  ? Pain in the arm, calf, back of the knee, thigh, or groin.  ? Redness and swelling in the arm, leg, or groin.   ??Watch closely for changes in your health, and be sure to contact your doctor if:  ?? ?? You do not get better as expected.   Where can you learn more?  Go to http://www.healthwise.net/[facility].  Enter D894 in the search box to learn more about "Deep Vein Thrombosis: Care Instructions."  Current as of: January 07, 2016  Content Version: 11.8  ?? 2006-2018 [employer]. Care instructions adapted under license by [facility] (which disclaims liability or warranty for this information). If you have questions about a medical condition or this instruction, always ask your healthcare professional. Healthwise, Incorporated disclaims any warranty or liability for your use of this information.
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
## 2016-12-30 NOTE — Telephone Encounter (Signed)
Patient requested we contact his pharmacy and clarify the LOVENOX instructions.  [facility] [address] but they are saying they need to clarify instructions in order to fill the prescription.  Please call [facility] @ [phone].

## 2016-12-30 NOTE — Telephone Encounter (Signed)
Please call pharmacy for confirmation or verbal to change.

## 2016-12-31 LAB — METABOLIC PANEL, COMPREHENSIVE
A-G Ratio: 1.3 (ref 0.8–1.7)
ALT (SGPT): 20 U/L (ref 16–61)
AST (SGOT): 14 U/L — ABNORMAL LOW (ref 15–37)
Albumin: 4.4 g/dL (ref 3.4–5.0)
Alk. phosphatase: 118 U/L — ABNORMAL HIGH (ref 45–117)
Anion gap: 13 mmol/L (ref 3.0–18)
BUN/Creatinine ratio: 14 (ref 12–20)
BUN: 15 MG/DL (ref 7.0–18)
Bilirubin, total: 0.5 MG/DL (ref 0.2–1.0)
CO2: 21 mmol/L (ref 21–32)
Calcium: 9.7 MG/DL (ref 8.5–10.1)
Chloride: 107 mmol/L (ref 100–108)
Creatinine: 1.07 MG/DL (ref 0.6–1.3)
GFR est AA: >60 mL/min/{1.73_m2} (ref >60)
GFR est non-AA: >60 mL/min/{1.73_m2} (ref >60)
Globulin: 3.5 g/dL (ref 2.0–4.0)
Glucose: 123 mg/dL — ABNORMAL HIGH (ref 74–99)
Potassium: 4.7 mmol/L (ref 3.5–5.5)
Protein, total: 7.9 g/dL (ref 6.4–8.2)
Sodium: 141 mmol/L (ref 136–145)

## 2017-01-20 ENCOUNTER — Ambulatory Visit: Admit: 2017-01-20 | Discharge: 2017-01-20 | Payer: MEDICARE | Attending: Hematology & Oncology

## 2017-01-20 ENCOUNTER — Inpatient Hospital Stay: Admit: 2017-01-20

## 2017-01-20 DIAGNOSIS — I82403 Acute embolism and thrombosis of unspecified deep veins of lower extremity, bilateral: Secondary | ICD-10-CM

## 2017-01-20 LAB — CBC WITH 3 PART DIFF
ABS. LYMPHOCYTES: 2 10*3/uL (ref 1.1–5.9)
ABS. MIXED CELLS: 0.4 10*3/uL (ref 0.0–2.3)
ABS. NEUTROPHILS: 5.5 10*3/uL (ref 1.8–9.5)
HCT: 45.8 % (ref 36–48)
HGB: 14.6 g/dL (ref 12.0–16)
LYMPHOCYTES: 25 % (ref 14–44)
MCH: 26.4 PG (ref 25.0–35.0)
MCHC: 31.9 g/dL (ref 31–37)
MCV: 82.7 FL (ref 78–102)
Mixed cells: 5 % (ref 0.1–17)
NEUTROPHILS: 70 % (ref 40–70)
PLATELET: 303 10*3/uL (ref 140–440)
RBC: 5.54 M/uL — ABNORMAL HIGH (ref 4.10–5.10)
RDW: 16.1 % — ABNORMAL HIGH (ref 11.5–14.5)
WBC: 7.9 10*3/uL (ref 4.5–13.0)

## 2017-01-20 NOTE — Progress Notes (Signed)
Hematology/medical oncology progress note    01/20/2017  Sean [name]  Date of birth: 05/19/1960    PCP: [name]    Diagnosis: Recurrent DVT, lower extremity    Mr. [name] is a 56 year old man has a history of recurrent DVT.  He had been on Arixtra for an extended period of time.  He was scheduled to have laparoscopic/arthroscopic surgery done on his right knee.  We transitioned him to Lovenox successfully had his procedure completed.  He is now back on his regular medication of Arixtra.  The patient thinks that he may need to have additional surgery early next year and we will therefore be available to transition him back to Lovenox as needed.  Otherwise I will continue to see him in clinic in 04-6214-month intervals.  The patient had his questions answered to his satisfaction.  Total time 25 minutes, greater than 50% of the time was in counseling and coordination of care.    Blas Riches A.Alanda SlimShabazz, MD, Jerrel IvoryFACP

## 2017-01-20 NOTE — Patient Instructions (Signed)
Deep Vein Thrombosis: Care Instructions  Your Care Instructions    A deep vein thrombosis (DVT) is a blood clot in certain veins of the legs, pelvis, or arms. Blood clots in these veins need to be treated because they can get bigger, break loose, and travel through the bloodstream to the lungs. A blood clot in a lung can be life-threatening.  The doctor may have given you a blood thinner (anticoagulant). A blood thinner can stop the blood clot from growing larger and prevent new clots from forming. You will need to take a blood thinner for 3 to 6 months or longer.  The doctor has checked you carefully, but problems can develop later. If you notice any problems or new symptoms, get medical treatment right away.  Follow-up care is a key part of your treatment and safety. Be sure to make and go to all appointments, and call your doctor if you are having problems. It's also a good idea to know your test results and keep a list of the medicines you take.  How can you care for yourself at home?  ?? Take your medicines exactly as prescribed. Call your doctor if you think you are having a problem with your medicine.  ?? If you are taking a blood thinner, be sure you get instructions about how to take your medicine safely. Blood thinners can cause serious bleeding problems.  ?? Wear compression stockings if your doctor recommends them. These stockings are tighter at the feet than on the legs. They may reduce pain and swelling in your legs. But there are different types of stockings, and they need to fit right. So your doctor will recommend what you need.  ?? When you sit, use a pillow to raise the arm or leg that has the blood clot. Try to keep it above the level of your heart.  When should you call for help?  Call 911 anytime you think you may need emergency care. For example, call if:  ?? ?? You passed out (lost consciousness).   ?? ?? You have symptoms of a blood clot in your lung (called a pulmonary  embolism). These include:  ? Sudden chest pain.  ? Trouble breathing.  ? Coughing up blood.   ??Call your doctor now or seek immediate medical care if:  ?? ?? You have new or worse trouble breathing.   ?? ?? You are dizzy or lightheaded, or you feel like you may faint.   ?? ?? You have symptoms of a blood clot in your arm or leg. These may include:  ? Pain in the arm, calf, back of the knee, thigh, or groin.  ? Redness and swelling in the arm, leg, or groin.   ??Watch closely for changes in your health, and be sure to contact your doctor if:  ?? ?? You do not get better as expected.   Where can you learn more?  Go to http://www.healthwise.net/[facility].  Enter D894 in the search box to learn more about "Deep Vein Thrombosis: Care Instructions."  Current as of: January 07, 2016  Content Version: 11.8  ?? 2006-2018 [employer]. Care instructions adapted under license by [facility] (which disclaims liability or warranty for this information). If you have questions about a medical condition or this instruction, always ask your healthcare professional. Healthwise, Incorporated disclaims any warranty or liability for your use of this information.

## 2017-02-17 NOTE — Telephone Encounter (Signed)
[name] wants to know if he will be seeing Dr. Alanda SlimShabazz for pain management or someone else?  He asked that someone call him at [phone].

## 2017-02-17 NOTE — Progress Notes (Signed)
Called patient back regarding if he was seeing dr. Alanda SlimShabazz for pain management.   Patient was aware and understanding he does not see Dr. Alanda SlimShabazz for this reason, he stated he was going to contact his PCP to get set up for pain management.

## 2017-03-29 ENCOUNTER — Encounter

## 2017-03-29 MED ORDER — ATENOLOL 100 MG TAB
100 mg | ORAL_TABLET | Freq: Every day | ORAL | 0 refills | Status: DC
Start: 2017-03-29 — End: 2017-04-05

## 2017-03-29 MED ORDER — AMLODIPINE-BENAZEPRIL 10 MG-40 MG CAP
10-40 mg | ORAL_CAPSULE | Freq: Every day | ORAL | 0 refills | Status: DC
Start: 2017-03-29 — End: 2017-04-05

## 2017-03-29 MED ORDER — ATORVASTATIN 40 MG TAB
40 mg | ORAL_TABLET | Freq: Every day | ORAL | 0 refills | Status: DC
Start: 2017-03-29 — End: 2017-04-05

## 2017-03-29 MED ORDER — OMEPRAZOLE 20 MG CAP, DELAYED RELEASE
20 mg | ORAL_CAPSULE | Freq: Every day | ORAL | 0 refills | Status: DC
Start: 2017-03-29 — End: 2017-04-05

## 2017-03-29 NOTE — Telephone Encounter (Addendum)
This patient contacted office for the following prescriptions to be filled:    Medication requested :   Requested Prescriptions     Pending Prescriptions Disp Refills   ??? amLODIPine-benazepril (LOTREL) 10-40 mg per capsule 30 Cap 5     Sig: Take 1 Cap by mouth daily.   ??? atenolol (TENORMIN) 100 mg tablet 30 Tab 5     Sig: Take 1 Tab by mouth daily.   ??? omeprazole (PRILOSEC) 20 mg capsule 30 Cap 5     Sig: Take 1 Cap by mouth daily.   ??? atorvastatin (LIPITOR) 40 mg tablet 30 Tab 5     Sig: Take 1 Tab by mouth daily.     PCP: Maryruth BunKapur  Pharmacy or Print: [facility]   Mail order or Local pharmacy [phone] [id number]     Scheduled appointment if not seen by current providers in office: LOV 09/22/2016 f/u 04/05/2017

## 2017-03-30 NOTE — Telephone Encounter (Signed)
LMOV advising patient that medications have been sent to pharmacy.  He was also reminded to have fasting labs completed prior to 04/05/17 appointment with [name].  Patient asked to call [facility] if he has any questions.

## 2017-04-02 ENCOUNTER — Inpatient Hospital Stay: Admit: 2017-04-02

## 2017-04-02 LAB — LABCORP SPECIMEN COLLECTION

## 2017-04-03 LAB — CBC W/O DIFF
HCT: 45.9 % (ref 37.5–51.0)
HGB: 14.9 g/dL (ref 13.0–17.7)
MCH: 26.3 pg — ABNORMAL LOW (ref 26.6–33.0)
MCHC: 32.5 g/dL (ref 31.5–35.7)
MCV: 81 fL (ref 79–97)
PLATELET: 278 10*3/uL (ref 150–379)
RBC: 5.67 x10E6/uL (ref 4.14–5.80)
RDW: 16.1 % — ABNORMAL HIGH (ref 12.3–15.4)
WBC: 7.7 10*3/uL (ref 3.4–10.8)

## 2017-04-03 LAB — METABOLIC PANEL, COMPREHENSIVE
A-G Ratio: 1.3 (ref 1.2–2.2)
ALT (SGPT): 17 IU/L (ref 0–44)
AST (SGOT): 10 IU/L (ref 0–40)
Albumin: 4.2 g/dL (ref 3.5–5.5)
Alk. phosphatase: 112 IU/L (ref 39–117)
BUN/Creatinine ratio: 9 (ref 9–20)
BUN: 11 mg/dL (ref 6–24)
Bilirubin, total: 0.4 mg/dL (ref 0.0–1.2)
CO2: 19 mmol/L — ABNORMAL LOW (ref 20–29)
Calcium: 9.8 mg/dL (ref 8.7–10.2)
Chloride: 105 mmol/L (ref 96–106)
Creatinine: 1.17 mg/dL (ref 0.76–1.27)
GFR est AA: 80 mL/min/{1.73_m2} (ref >59)
GFR est non-AA: 69 mL/min/{1.73_m2} (ref >59)
GLOBULIN, TOTAL: 3.3 g/dL (ref 1.5–4.5)
Glucose: 132 mg/dL — ABNORMAL HIGH (ref 65–99)
Potassium: 4.6 mmol/L (ref 3.5–5.2)
Protein, total: 7.5 g/dL (ref 6.0–8.5)
Sodium: 139 mmol/L (ref 134–144)

## 2017-04-03 LAB — LIPID PANEL
Cholesterol, total: 201 mg/dL — ABNORMAL HIGH (ref 100–199)
HDL Cholesterol: 32 mg/dL — ABNORMAL LOW (ref >39)
LDL, calculated: 114 mg/dL — ABNORMAL HIGH (ref 0–99)
Triglyceride: 273 mg/dL — ABNORMAL HIGH (ref 0–149)
VLDL, calculated: 55 mg/dL — ABNORMAL HIGH (ref 5–40)

## 2017-04-03 LAB — CVD REPORT

## 2017-04-03 LAB — PSA SCREENING (SCREENING): Prostate Specific Ag: 5.2 ng/mL — ABNORMAL HIGH (ref 0.0–4.0)

## 2017-04-05 ENCOUNTER — Ambulatory Visit: Admit: 2017-04-05 | Discharge: 2017-04-05 | Payer: MEDICARE | Attending: Family Medicine

## 2017-04-05 DIAGNOSIS — E119 Type 2 diabetes mellitus without complications: Secondary | ICD-10-CM

## 2017-04-05 LAB — AMB POC HEMOGLOBIN A1C: Hemoglobin A1c (POC): 6.7 %

## 2017-04-05 MED ORDER — OMEPRAZOLE 20 MG CAP, DELAYED RELEASE
20 mg | ORAL_CAPSULE | Freq: Every day | ORAL | 5 refills | Status: DC
Start: 2017-04-05 — End: 2017-10-04

## 2017-04-05 MED ORDER — ATORVASTATIN 40 MG TAB
40 mg | ORAL_TABLET | Freq: Every day | ORAL | 5 refills | Status: DC
Start: 2017-04-05 — End: 2017-10-04

## 2017-04-05 MED ORDER — AMLODIPINE-BENAZEPRIL 10 MG-40 MG CAP
10-40 mg | ORAL_CAPSULE | Freq: Every day | ORAL | 5 refills | Status: DC
Start: 2017-04-05 — End: 2017-06-16

## 2017-04-05 MED ORDER — ATENOLOL 100 MG TAB
100 mg | ORAL_TABLET | Freq: Every day | ORAL | 5 refills | Status: DC
Start: 2017-04-05 — End: 2017-10-04

## 2017-04-05 NOTE — Patient Instructions (Addendum)
Learning About Diabetes Food Guidelines  Your Care Instructions    Meal planning is important to manage diabetes. It helps keep your blood sugar at a target level (which you set with your doctor). You don't have to eat special foods. You can eat what your family eats, including sweets once in a while. But you do have to pay attention to how often you eat and how much you eat of certain foods.  You may want to work with a dietitian or a certified diabetes educator (CDE) to help you plan meals and snacks. A dietitian or CDE can also help you lose weight if that is one of your goals.  What should you know about eating carbs?  Managing the amount of carbohydrate (carbs) you eat is an important part of healthy meals when you have diabetes. Carbohydrate is found in many foods.  ?? Learn which foods have carbs. And learn the amounts of carbs in different foods.  ? Bread, cereal, pasta, and rice have about 15 grams of carbs in a serving. A serving is 1 slice of bread (1 ounce), ?? cup of cooked cereal, or 1/3 cup of cooked pasta or rice.  ? Fruits have 15 grams of carbs in a serving. A serving is 1 small fresh fruit, such as an apple or orange; ?? of a banana; ?? cup of cooked or canned fruit; ?? cup of fruit juice; 1 cup of melon or raspberries; or 2 tablespoons of dried fruit.  ? Milk and no-sugar-added yogurt have 15 grams of carbs in a serving. A serving is 1 cup of milk or 2/3 cup of no-sugar-added yogurt.  ? Starchy vegetables have 15 grams of carbs in a serving. A serving is ?? cup of mashed potatoes or sweet potato; 1 cup winter squash; ?? of a small baked potato; ?? cup of cooked beans; or ?? cup cooked corn or green peas.  ?? Learn how much carbs to eat each day and at each meal. A dietitian or CDE can teach you how to keep track of the amount of carbs you eat. This is called carbohydrate counting.  ?? If you are not sure how to count carbohydrate grams, use the Plate  Method to plan meals. It is a good, quick way to make sure that you have a balanced meal. It also helps you spread carbs throughout the day.  ? Divide your plate by types of foods. Put non-starchy vegetables on half the plate, meat or other protein food on one-quarter of the plate, and a grain or starchy vegetable in the final quarter of the plate. To this you can add a small piece of fruit and 1 cup of milk or yogurt, depending on how many carbs you are supposed to eat at a meal.  ?? Try to eat about the same amount of carbs at each meal. Do not "save up" your daily allowance of carbs to eat at one meal.  ?? Proteins have very little or no carbs per serving. Examples of proteins are beef, chicken, turkey, fish, eggs, tofu, cheese, cottage cheese, and peanut butter. A serving size of meat is 3 ounces, which is about the size of a deck of cards. Examples of meat substitute serving sizes (equal to 1 ounce of meat) are 1/4 cup of cottage cheese, 1 egg, 1 tablespoon of peanut butter, and ?? cup of tofu.  How can you eat out and still eat healthy?  ?? Learn to estimate the serving sizes of foods   that have carbohydrate. If you measure food at home, it will be easier to estimate the amount in a serving of restaurant food.  ?? If the meal you order has too much carbohydrate (such as potatoes, corn, or baked beans), ask to have a low-carbohydrate food instead. Ask for a salad or green vegetables.  ?? If you use insulin, check your blood sugar before and after eating out to help you plan how much to eat in the future.  ?? If you eat more carbohydrate at a meal than you had planned, take a walk or do other exercise. This will help lower your blood sugar.  What else should you know?  ?? Limit saturated fat, such as the fat from meat and dairy products. This is a healthy choice because people who have diabetes are at higher risk of heart disease. So choose lean cuts of meat and nonfat or low-fat dairy  products. Use olive or canola oil instead of butter or shortening when cooking.  ?? Don't skip meals. Your blood sugar may drop too low if you skip meals and take insulin or certain medicines for diabetes.  ?? Check with your doctor before you drink alcohol. Alcohol can cause your blood sugar to drop too low. Alcohol can also cause a bad reaction if you take certain diabetes medicines.  Follow-up care is a key part of your treatment and safety. Be sure to make and go to all appointments, and call your doctor if you are having problems. It's also a good idea to know your test results and keep a list of the medicines you take.  Where can you learn more?  Go to InsuranceStats.cahttp://www.healthwise.net/GoodHelpConnections.  Enter 506-330-4548I147 in the search box to learn more about "Learning About Diabetes Food Guidelines."  Current as of: September 09, 2016  Content Version: 11.9  ?? 2006-2018 [employer]. Care instructions adapted under license by [employer] (which disclaims liability or warranty for this information). If you have questions about a medical condition or this instruction, always ask your healthcare professional. [employer], Inc[employer] disclaims any warranty or liability for your use of this information.         A Healthy Lifestyle: Care Instructions  Your Care Instructions    A healthy lifestyle can help you feel good, stay at a healthy weight, and have plenty of energy for both work and play. A healthy lifestyle is something you can share with your whole family.  A healthy lifestyle also can lower your risk for serious health problems, such as high blood pressure, heart disease, and diabetes.  You can follow a few steps listed below to improve your health and the health of your family.  Follow-up care is a key part of your treatment and safety. Be sure to make and go to all appointments, and call your doctor if you are having problems. It's also a good idea to know your test results and keep a list  of the medicines you take.  How can you care for yourself at home?  ?? Do not eat too much sugar, fat, or fast foods. You can still have dessert and treats now and then. The goal is moderation.  ?? Start small to improve your eating habits. Pay attention to portion sizes, drink less juice and soda pop, and eat more fruits and vegetables.  ? Eat a healthy amount of food. A 3-ounce serving of meat, for example, is about the size of a deck of cards. Fill the rest of  your plate with vegetables and whole grains.  ? Limit the amount of soda and sports drinks you have every day. Drink more water when you are thirsty.  ? Eat at least 5 servings of fruits and vegetables every day. It may seem like a lot, but it is not hard to reach this goal. A serving or helping is 1 piece of fruit, 1 cup of vegetables, or 2 cups of leafy, raw vegetables. Have an apple or some carrot sticks as an afternoon snack instead of a candy bar. Try to have fruits and/or vegetables at every meal.  ?? Make exercise part of your daily routine. You may want to start with simple activities, such as walking, bicycling, or slow swimming. Try to be active 30 to 60 minutes every day. You do not need to do all 30 to 60 minutes all at once. For example, you can exercise 3 times a day for 10 or 20 minutes. Moderate exercise is safe for most people, but it is always a good idea to talk to your doctor before starting an exercise program.  ?? Keep moving. Mow the lawn, work in the garden, or [employer] your house. Take the stairs instead of the elevator at work.  ?? If you smoke, quit. People who smoke have an increased risk for heart attack, stroke, cancer, and other lung illnesses. Quitting is hard, but there are ways to boost your chance of quitting tobacco for good.  ? Use nicotine gum, patches, or lozenges.  ? Ask your doctor about stop-smoking programs and medicines.  ? Keep trying.  In addition to reducing your risk of diseases in the future, you will  notice some benefits soon after you stop using tobacco. If you have shortness of breath or asthma symptoms, they will likely get better within a few weeks after you quit.  ?? Limit how much alcohol you drink. Moderate amounts of alcohol (up to 2 drinks a day for men, 1 drink a day for women) are okay. But drinking too much can lead to liver problems, high blood pressure, and other health problems.  Family health  If you have a family, there are many things you can do together to improve your health.  ?? Eat meals together as a family as often as possible.  ?? Eat healthy foods. This includes fruits, vegetables, lean meats and dairy, and whole grains.  ?? Include your family in your fitness plan. Most people think of activities such as jogging or tennis as the way to fitness, but there are many ways you and your family can be more active. Anything that makes you breathe hard and gets your heart pumping is exercise. Here are some tips:  ? Walk to do errands or to take your child to school or the bus.  ? Go for a family bike ride after dinner instead of watching TV.  Where can you learn more?  Go to InsuranceStats.cahttp://www.healthwise.net/GoodHelpConnections.  Enter 618-839-3407U807 in the search box to learn more about "A Healthy Lifestyle: Care Instructions."  Current as of: October 27, 2016  Content Version: 11.9  ?? 2006-2018 [employer]. Care instructions adapted under license by [employer] (which disclaims liability or warranty for this information). If you have questions about a medical condition or this instruction, always ask your healthcare professional. [employer]wise, Incorporated disclaims any warranty or liability for your use of this information.

## 2017-04-05 NOTE — Progress Notes (Signed)
SUBJECTIVE  Chief Complaint   Patient presents with   ??? Diabetes   ??? Hypertension   ??? Cholesterol Problem   ??? GERD   ??? Obesity   ??? Blood Clot     Sean [name] is a 57 y.o. year old male, Sean [name] is seen today for follow-up.    Most recently Sean [name] was in the ER for sinus symptoms.  Sean [name] was advised to get on claritin after a CT scan of his sinuses showing fluid present and signs of sinusitis.      Sean [name] currently sees a hematologist and says Sean [name] has been compliant with his Arixtra.   Sean [name] has no complaints.    Sean [name] has been checking his blood sugars which are in the 120-140 range.  Sean [name] has changed his diet a lot due to the diabetes.  Sean [name] is only taking Lantus 60U at night.  Sean [name] stopped his metformin and Januvia several months back.  Sean [name] is under the care of endocrinology but Sean [name] has not been keeping up with them.    Sean [name] has been largely noncompliant in various ways with follow-up and following treatment plans.    Sean [name] has not done CRCS.      OBJECTIVE    Blood pressure 120/82, pulse 77, temperature 98.4 ??F (36.9 ??C), temperature source Oral, resp. rate 16, height 6\' 5"  (1.956 m), weight 327 lb (148.3 kg), SpO2 97 %.  General:  Alert, cooperative, well appearing, in no apparent distress.  Eyes:  Pupils are equally round and reactive to light with accommodation.  The extra-ocular movements are intact.  The lids are without swelling, lesions, or drainage.  The conjunctiva are clear and noninjected.  ENT:    The tongue and mucous membranes are pink and moist without lesions.   Neck:  The thyroid is normal size without nodules or masses.  There is no lymphadenopathy.  There are no carotid bruits.  CV:  The heart sounds are regular in rate and rhythm.  There is a normal S1 and S2.   Lungs:  Inspiratory and expiratory efforts are full and unlabored.  Lung sounds are clear and equal to auscultation throughout all lung fields without wheezing, rales, or rhonchi.  Skin:  No pedal rashes, no jaundice.   Psych: normal affect.  Mood good.  Oriented x 3.  Judgement and insight intact.     Results for orders placed or performed in visit on 04/05/17   AMB POC HEMOGLOBIN A1C   Result Value Ref Range    Hemoglobin A1c (POC) 6.7 %     ASSESSMENT / PLAN    ICD-10-CM ICD-9-CM    1. Type 2 diabetes mellitus without complication, with long-term current use of insulin (HCC) E11.9 250.00 AMB POC HEMOGLOBIN A1C    Z79.4 V58.67    2. Essential hypertension I10 401.9 amLODIPine-benazepril (LOTREL) 10-40 mg per capsule      atenolol (TENORMIN) 100 mg tablet   3. Gastroesophageal reflux disease without esophagitis K21.9 530.81 omeprazole (PRILOSEC) 20 mg capsule   4. Hypercholesterolemia E78.00 272.0 atorvastatin (LIPITOR) 40 mg tablet   5. Obesity, morbid (HCC) E66.01 278.01    6. Deep vein thrombosis (DVT) of proximal lower extremity, unspecified chronicity, unspecified laterality (HCC) I82.4Y9 453.41    7. Elevated PSA R97.20 790.93 REFERRAL TO UROLOGY     DM with microalbuminuria - A1c reviewed.  Cont current care with the endocrinologist.  Encourage better follow-up. Cont ACE inhibitor.   Cont current meds.  Cont to try to motivate  to have eyes checked annually.      HTN - controlled.  Refill sent.  DASH dieting.    GERD - Sean [name] is advised that Sean [name] is overdue for colonoscopy. Sean [name] has been aware.  Refills of prilosec for ongoing GERD should better be managed by GI but I have refilled this since Sean [name] insists.    Hypercholesterolemia -  Advised to cont statin. No hepatotoxicity.  LDL with slight elevation.    Obesity - exercise is limited.  Cont dieting.    DVT - cont per hematology.      Elevated PSA - refer to urology.     All chart history elements were reviewed by me at the time of the visit even though marked at time of note closure. Patient understands our medical plan. Patient has provided input and agrees with goals. Alternatives have been explained and offered.  All questions answered.   The patient is to call if condition worsens or fails to improve.     Follow-up Disposition:  Return in about 6 months (around 10/03/2017) for routine care.  A1c to be done in office.

## 2017-04-05 NOTE — Progress Notes (Signed)
Chief Complaint   Patient presents with   ??? Diabetes   ??? Hypertension   ??? Cholesterol Problem   ??? GERD   ??? Obesity   ??? Blood Clot     Colonoscopy: due   Has not seen endo since 2017.    1. Have you been to the ER, urgent care clinic since your last visit?  [facility] your last visit?[facility]     2. Have you seen or consulted any other health [facility] providers [facility] since your last visit?  Include any pap smears or colon screening. Yes Where: Dr. Alanda SlimShabazz    Per patient he has been using CBD oil on legs.

## 2017-04-21 ENCOUNTER — Encounter: Attending: Hematology & Oncology

## 2017-04-21 NOTE — Telephone Encounter (Signed)
Left voice message on cell [phone] to call office to reschedule.

## 2017-06-09 ENCOUNTER — Ambulatory Visit: Admit: 2017-06-09 | Discharge: 2017-06-09 | Payer: MEDICARE | Attending: Hematology & Oncology

## 2017-06-09 ENCOUNTER — Inpatient Hospital Stay: Admit: 2017-06-09

## 2017-06-09 DIAGNOSIS — I82403 Acute embolism and thrombosis of unspecified deep veins of lower extremity, bilateral: Secondary | ICD-10-CM

## 2017-06-09 LAB — CBC WITH 3 PART DIFF
ABS. LYMPHOCYTES: 2.3 10*3/uL (ref 1.1–5.9)
ABS. MIXED CELLS: 0.3 10*3/uL (ref 0.0–2.3)
ABS. NEUTROPHILS: 4.3 10*3/uL (ref 1.8–9.5)
HCT: 46.8 % (ref 36–48)
HGB: 15.3 g/dL (ref 12.0–16)
LYMPHOCYTES: 34 % (ref 14–44)
MCH: 26.3 PG (ref 25.0–35.0)
MCHC: 32.7 g/dL (ref 31–37)
MCV: 80.4 FL (ref 78–102)
Mixed cells: 5 % (ref 0.1–17)
NEUTROPHILS: 61 % (ref 40–70)
PLATELET: 241 10*3/uL (ref 140–440)
RBC: 5.82 M/uL — ABNORMAL HIGH (ref 4.10–5.10)
RDW: 16.3 % — ABNORMAL HIGH (ref 11.5–14.5)
WBC: 6.9 10*3/uL (ref 4.5–13.0)

## 2017-06-09 NOTE — Patient Instructions (Signed)
Deep Vein Thrombosis: Care Instructions  Your Care Instructions    A deep vein thrombosis (DVT) is a blood clot in certain veins of the legs, pelvis, or arms. Blood clots in these veins need to be treated because they can get bigger, break loose, and travel through the bloodstream to the lungs. A blood clot in a lung can be life-threatening.  The doctor may have given you a blood thinner (anticoagulant). A blood thinner can stop the blood clot from growing larger and prevent new clots from forming. You will need to take a blood thinner for 3 to 6 months or longer.  The doctor has checked you carefully, but problems can develop later. If you notice any problems or new symptoms, get medical treatment right away.  Follow-up care is a key part of your treatment and safety. Be sure to make and go to all appointments, and call your doctor if you are having problems. It's also a good idea to know your test results and keep a list of the medicines you take.  How can you care for yourself at home?  ?? Take your medicines exactly as prescribed. Call your doctor if you think you are having a problem with your medicine.  ?? If you are taking a blood thinner, be sure you get instructions about how to take your medicine safely. Blood thinners can cause serious bleeding problems.  ?? Wear compression stockings if your doctor recommends them. These stockings are tighter at the feet than on the legs. They may reduce pain and swelling in your legs. But there are different types of stockings, and they need to fit right. So your doctor will recommend what you need.  ?? When you sit, use a pillow to raise the arm or leg that has the blood clot. Try to keep it above the level of your heart.  When should you call for help?  Call 911 anytime you think you may need emergency care. For example, call if:  ?? ?? You passed out (lost consciousness).   ?? ?? You have symptoms of a blood clot in your lung (called a pulmonary  embolism). These include:  ? Sudden chest pain.  ? Trouble breathing.  ? Coughing up blood.   ??Call your doctor now or seek immediate medical care if:  ?? ?? You have new or worse trouble breathing.   ?? ?? You are dizzy or lightheaded, or you feel like you may faint.   ?? ?? You have symptoms of a blood clot in your arm or leg. These may include:  ? Pain in the arm, calf, back of the knee, thigh, or groin.  ? Redness and swelling in the arm, leg, or groin.   ??Watch closely for changes in your health, and be sure to contact your doctor if:  ?? ?? You do not get better as expected.   Where can you learn more?  Go to [employer].  Enter [phone] in the search box to learn more about "Deep Vein Thrombosis: Care Instructions."  Current as of: November 11, 2016  Content Version: 11.9  ?? 2006-2018 [employer]. Care instructions adapted under license by [employer] (which disclaims liability or warranty for this information). If you have questions about a medical condition or this instruction, always ask your healthcare professional. [employer] disclaims any warranty or liability for your use of this information.    Fondaparinux (By injection)   Fondaparinux (fon-da-PAR-in-ux)  Treats blood clots and prevents them from  forming after hip, knee, or stomach surgery. This medicine is a blood thinner.   Brand Name(s): Arixtra, Fondaparinux Sodium Novaplus   There may be other brand names for this medicine.  When This Medicine Should Not Be Used:   This medicine is not right for everyone. Do not use it if you had an allergic reaction to fondaparinux.   How to Use This Medicine:   Injectable  ?? Your doctor will prescribe your exact dose and tell you how often it should be given. This medicine is given as a shot under your skin.  ?? A nurse or other health provider will give you this medicine.  ?? You may be taught how to give your medicine at home. Make sure you  understand all instructions before giving yourself an injection. Do not use more medicine or use it more often than your doctor tells you to.  ?? Alternate sides of your abdomen for each injection.  ?? After you remove the needle guard, do not let the needle touch anything before you inject the medicine. This will help prevent infection.  ?? Read and follow the patient instructions that come with this medicine. Talk to your doctor or pharmacist if you have any questions.  ?? Missed dose: Take a dose as soon as you remember. If it is almost time for your next dose, wait until then and take a regular dose. Do not take extra medicine to make up for a missed dose.  ?? If you store this medicine at home, keep it at room temperature, away from heat and direct light.  ?? Throw away used needles in a hard, closed container that the needles cannot poke through. Keep this container away from children and pets.  Drugs and Foods to Avoid:   Ask your doctor or pharmacist before using any other medicine, including over-the-counter medicines, vitamins, and herbal products.  ?? Some medicines can affect how fondaparinux works. Tell your doctor if you are using an NSAID pain or arthritis medicine (such as aspirin, ibuprofen, naproxen) or other blood thinners (such as clopidogrel, warfarin).  Warnings While Using This Medicine:   ?? Tell your doctor if you are pregnant or breastfeeding, or if you have kidney disease, liver disease, any bleeding problems, high blood pressure, diabetic retinopathy, a stomach ulcer, or a history of stroke or heart infection. Tell your doctor if you recently had brain, back, or eye surgery. Tell your doctor if you have an allergy to latex.  ?? This medicine may cause the following problems:  ?? Increased risk of bleeding  ?? Risk of spinal or epidural blood clots after spinal procedures  ?? Tell any doctor or dentist who treats you that you are using this  medicine. You may need to stop using this medicine several days before you have surgery or medical tests.  ?? Your doctor will do lab tests at regular visits to check on the effects of this medicine. Keep all appointments.  ?? Keep all medicine out of the reach of children. Never share your medicine with anyone.  Possible Side Effects While Using This Medicine:   Call your doctor right away if you notice any of these side effects:  ?? Allergic reaction: Itching or hives, swelling in your face or hands, swelling or tingling in your mouth or throat, chest tightness, trouble breathing  ?? Blood in your urine or stools  ?? Change in how much or how often you urinate  ?? Loss of bowel or bladder control  ??  Numbness or tingling in your legs, back pain  ?? Tiny red dots on the skin, especially on the lower legs  ?? Unexplained nosebleeds  ?? Unusual bleeding, bruising, or weakness  If you notice these less serious side effects, talk with your doctor:   ?? Mild bleeding, itching, or rash where the shot was given  If you notice other side effects that you think are caused by this medicine, tell your doctor.   Call your doctor for medical advice about side effects. You may report side effects to FDA at 1-800-FDA-1088  ?? 2017 [facility] [employer] Information is for End User's use only and may not be sold, redistributed or otherwise used for commercial purposes.  The above information is an educational aid only. It is not intended as medical advice for individual conditions or treatments. Talk to your doctor, nurse or pharmacist before following any medical regimen to see if it is safe and effective for you.

## 2017-06-09 NOTE — Progress Notes (Signed)
Hematology/Oncology  Progress Note  Name: Sean [name]  Date: 06/09/2017  DOB: 1960-12-20    Truitt Leep, MD     Mr. Capozzi is a 57 y.o. year old male who was seen for recurrent DVT    Current therapy: Arixtra twice a day    Subjective:     Mr. Sainato is a 57 year old man has a history of recurrent DVT.  He had been on Arixtra for an extended period of time.  He was scheduled to have laparoscopic/arthroscopic surgery done on his right knee.  We transitioned him to Lovenox successfully had his procedure completed.  He is now back on his regular medication of Arixtra.  The patient thinks that he may need to have additional surgery early next year and we will therefore be available to transition him back to Lovenox as needed. Otherwise he does not have any issues to report he states he has been complaint to taking his Arixtra         Past medical history, family history, and social history: these were reviewed and remains unchanged.    Past Medical History:   Diagnosis Date   ??? Cyst of left kidney     referred to urology   ??? DVT (deep venous thrombosis) ([place])    ??? GERD (gastroesophageal reflux disease)    ??? 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 H/O echocardiogram 10/2015    [facility] - unchanged from 2014, EF 55%, LVH, mild aortic regurge, mild diastolic dysfunction   ??? H/O esophagogastroduodenoscopy 11/16/11    Dr. Sula Rumple; normal stomach and duodenum; hiatal hernia in the cardia   ??? High cholesterol    ??? History of stress test 06/2012    [facility] - negative stress ECHO   ??? Hypertension    ??? Prediabetes    ??? S/P colonoscopy with polypectomy 11/16/11    Dr. Sula Rumple; polyps x 3; otherwise normal colonoscopy; f/u with gastro in 1 year   ??? Thromboembolus ([place])     last one 5 to 6 months ago   ??? Unspecified sleep apnea     no CPAP     Past Surgical History:   Procedure Laterality Date   ??? ABDOMEN SURGERY PROC UNLISTED      ulcer in stomach removed   ??? HX OTHER SURGICAL  3 years ago    [employer], inserted and removed      Social History     Socioeconomic History   ??? Marital status: SINGLE     Spouse name: Not on file   ??? Number of children: Not on file   ??? Years of education: Not on file   ??? Highest education level: Not on file   Occupational History   ??? Occupation: Disable   Social Needs   ??? Financial resource strain: Not on file   ??? Food insecurity:     Worry: Not on file     Inability: Not on file   ??? Transportation needs:     Medical: Not on file     Non-medical: Not on file   Tobacco Use   ??? Smoking status: Current Every Day Smoker     Packs/day: 0.80     Years: 15.00     Pack years: 12.00     Types: Cigarettes   ??? Smokeless tobacco: Never Used   Substance and Sexual Activity   ??? Alcohol use: No   ??? Drug use: No   ??? Sexual activity: Yes  Partners: Female   Lifestyle   ??? Physical activity:     Days per week: Not on file     Minutes per session: Not on file   ??? Stress: Not on file   Relationships   ??? Social connections:     Talks on phone: Not on file     Gets together: Not on file     Attends religious service: Not on file     Active member of club or organization: Not on file     Attends meetings of clubs or organizations: Not on file     Relationship status: Not on file   ??? Intimate partner violence:     Fear of current or ex partner: Not on file     Emotionally abused: Not on file     Physically abused: Not on file     Forced sexual activity: Not on file   Other Topics Concern   ??? Not on file   Social History Narrative   ??? Not on file     Family History   Problem Relation Age of Onset   ??? Heart Attack Mother    ??? Hypertension Mother    ??? Arthritis-rheumatoid Mother    ??? Heart Failure Mother    ??? High Cholesterol Mother    ??? Hypertension Father    ??? Alcohol abuse Father    ??? Other Father         Tobacco Use     Current Outpatient Medications   Medication Sig Dispense Refill   ??? amLODIPine-benazepril (LOTREL) 10-40 mg per capsule Take 1 Cap by mouth daily. 30 Cap 5    ??? atenolol (TENORMIN) 100 mg tablet Take 1 Tab by mouth daily. 30 Tab 5   ??? omeprazole (PRILOSEC) 20 mg capsule Take 1 Cap by mouth daily. 30 Cap 5   ??? atorvastatin (LIPITOR) 40 mg tablet Take 1 Tab by mouth daily. 30 Tab 5   ??? INSULIN GLARGINE,HUM.REC.ANLOG (LANTUS SC) 60 Units by SubCUTAneous route nightly.     ??? fondaparinux (ARIXTRA) 10 mg/0.8 mL syrg 0.8 mg by SubCUTAneous route two (2) times a day.     ??? glucose (TRUEPLUS GLUCOSE) 4 gram chewable tablet Take 15 g by mouth. Take 1-5 Tabs by Mouth Take As Needed (Treatment initiated when BG LESS THAN '70mg'$ /dl) for up to 30 days.     ??? glucose blood VI test strips (ASCENSIA AUTODISC VI, ONE TOUCH ULTRA TEST VI) strip Please specify Brand One Touch Ultra 2 or One Touch UltraMini blood glucose meters).  ICD 10 code : E11 Type 2 Diabetes  Is patient on insulin: Yes Z279.4 Long Term (current) Insulin Use     ??? glucose blood VI test strips (ASCENSIA AUTODISC VI, ONE TOUCH ULTRA TEST VI) strip Check fasting and 1 hour after eating as needed 1 Package 11   ??? Blood-Glucose Meter monitoring kit Use as directed (Patient taking differently: One Touch Ultra 2 DX: E11.9) 1 kit 0   ??? Lancets misc Use as directed (Patient taking differently: One Touch Delica Lancets T55.7 Type 2 Diabetes) 1 Package 11       Review of Systems  Constitutional: The patient has no acute distress or discomfort.  HEENT: The patient denies recent head trauma, eye pain, blurred vision,  hearing deficit, oropharyngeal mucosal pain or lesions, and the patient denies throat pain or discomfort.  Lymphatics: The patient denies palpable peripheral lymphadenopathy.  Hematologic: The patient denies having bruising, bleeding, or progressive  fatigue.  Respiratory: Patient denies having shortness of breath, cough, sputum production, fever, or dyspnea on exertion.  Cardiovascular: The patient denies having leg pain, leg swelling, heart palpitations, chest permit, chest pain, or lightheadedness.  The patient  denies having dyspnea on exertion.  Gastrointestinal: The patient denies having nausea, emesis, or diarrhea. The patient denies having any hematemesis or blood in the stool.  Genitourinary: Patient denies having urinary urgency, frequency, or dysuria.  The patient denies having blood in the urine.  Psychological: The patient denies having symptoms of nervousness, anxiety, depression, or thoughts of harming self.  Skin: Patient denies having skin rashes, skin, ulcerations, or unexplained itching or pruritus.  Musculoskeletal: The patient denies having pain in the joints or bones.      Objective:     Visit Vitals  BP (!) 157/94   Pulse (!) 104   Temp 99 ??F (37.2 ??C) (Oral)   Resp 18   Ht _0  (1.956 m)   Wt 148.8 kg (328 lb)   SpO2 98%   BMI 38.90 kg/m??     ECOG PS0  Physical Exam:   Gen. Appearance: The patient is in no acute distress.  Skin: There is no bruise or rash.  HEENT: The exam is unremarkable.  Neck: Supple without lymphadenopathy or thyromegaly.  Lungs: Clear to auscultation and percussion; there are no wheezes or rhonchi.  Heart: Regular rate and rhythm; there are no murmurs, gallops, or rubs.  Abdomen: Bowel sounds are present and normal.  There is no guarding, tenderness, or hepatosplenomegaly.  Extremities: There is no clubbing, cyanosis, or edema.  Neurologic: There are no focal neurologic deficits.  Lymphatics: There is no palpable peripheral lymphadenopathy. Musculoskeletal: The patient has full range of motion at all joints.  There is no evidence of joint deformity or effusions.  There is no focal joint tenderness.  Psychological/psychiatric: There is no clinical evidence of anxiety, depression, or melancholy.    Lab data:      Results for orders placed or performed during the hospital encounter of 06/09/17   CBC WITH 3 PART DIFF     Status: Abnormal   Result Value Ref Range Status    WBC 6.9 4.5 - 13.0 K/uL Final    RBC 5.82 (H) 4.10 - 5.10 M/uL Final    HGB 15.3 12.0 - 16 g/dL Final     HCT 46.8 36 - 48 % Final    MCV 80.4 78 - 102 FL Final    MCH 26.3 25.0 - 35.0 PG Final    MCHC 32.7 31 - 37 g/dL Final    RDW 16.3 (H) 11.5 - 14.5 % Final    PLATELET 241 140 - 440 K/uL Final    NEUTROPHILS 61 40 - 70 % Final    MIXED CELLS 5 0.1 - 17 % Final    LYMPHOCYTES 34 14 - 44 % Final    ABS. NEUTROPHILS 4.3 1.8 - 9.5 K/UL Final    ABS. MIXED CELLS 0.3 0.0 - 2.3 K/uL Final    ABS. LYMPHOCYTES 2.3 1.1 - 5.9 K/UL Final     Comment: Test performed at [facility] or Outpatient Infusion Center Location. Reviewed by Medical Director.    DF AUTOMATED   Final           Assessment:     1. 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 I have inform him that his CBC shows a WBC of 6.9 today, Hemoglobin of 15.3 g/dl and hematocrit of 46.8%. PLT is normal at 241,000. Follow up in 4 months      Orders Placed This Encounter   ??? COMPLETE CBC & AUTO DIFF WBC   ??? InHouse CBC ([facility])     Standing Status:   Future     Number of Occurrences:   1     Standing Expiration Date:   08/23/2421   ??? METABOLIC PANEL, COMPREHENSIVE     Standing Status:   Future     Number of Occurrences:   1     Standing Expiration Date:   06/10/2018       Bunnie Pion, NP  06/09/2017       I have assessed the patient independently and  agree with the full assessment as outlined.  Joycelyn Das, MD, FACP      Please note: This document has been produced using voice recognition software.  Unrecognized errors in transcription may be present.

## 2017-06-10 LAB — METABOLIC PANEL, COMPREHENSIVE
A-G Ratio: 1.3 (ref 1.2–2.2)
ALT (SGPT): 14 IU/L (ref 0–44)
AST (SGOT): 11 IU/L (ref 0–40)
Albumin: 4.2 g/dL (ref 3.5–5.5)
Alk. phosphatase: 108 IU/L (ref 39–117)
BUN/Creatinine ratio: 10 (ref 9–20)
BUN: 11 mg/dL (ref 6–24)
Bilirubin, total: 0.3 mg/dL (ref 0.0–1.2)
CO2: 18 mmol/L — ABNORMAL LOW (ref 20–29)
Calcium: 9.7 mg/dL (ref 8.7–10.2)
Chloride: 103 mmol/L (ref 96–106)
Creatinine: 1.11 mg/dL (ref 0.76–1.27)
GFR est AA: 85 mL/min/{1.73_m2} (ref >59)
GFR est non-AA: 73 mL/min/{1.73_m2} (ref >59)
GLOBULIN, TOTAL: 3.2 g/dL (ref 1.5–4.5)
Glucose: 173 mg/dL — ABNORMAL HIGH (ref 65–99)
Potassium: 3.9 mmol/L (ref 3.5–5.2)
Protein, total: 7.4 g/dL (ref 6.0–8.5)
Sodium: 140 mmol/L (ref 134–144)

## 2017-06-16 ENCOUNTER — Encounter

## 2017-06-16 NOTE — Telephone Encounter (Signed)
This patient contacted office for the following prescriptions to be filled:    Medication requested :   Requested Prescriptions     Pending Prescriptions Disp Refills   ??? amLODIPine-benazepril (LOTREL) 10-40 mg per capsule 30 Cap 5     Sig: Take 1 Cap by mouth daily.     PCP: Maryruth Bun  Pharmacy or Print: [facility]   Mail order or Local pharmacy [phone]     Scheduled appointment if not seen by current providers in office: LOV 04/05/2017 f/u 10/04/2017

## 2017-06-18 MED ORDER — AMLODIPINE-BENAZEPRIL 10 MG-40 MG CAP
10-40 mg | ORAL_CAPSULE | Freq: Every day | ORAL | 3 refills | Status: DC
Start: 2017-06-18 — End: 2017-10-04

## 2017-06-25 ENCOUNTER — Encounter: Attending: Specialist

## 2017-08-03 ENCOUNTER — Encounter: Attending: Specialist

## 2017-10-04 ENCOUNTER — Ambulatory Visit: Attending: Family Medicine

## 2017-10-04 ENCOUNTER — Ambulatory Visit: Admit: 2017-10-04 | Discharge: 2017-10-04 | Payer: MEDICARE | Attending: Family Medicine

## 2017-10-04 ENCOUNTER — Encounter: Attending: Family Medicine

## 2017-10-04 DIAGNOSIS — Z0000 Encounter for general adult medical examination without abnormal findings: Secondary | ICD-10-CM

## 2017-10-04 DIAGNOSIS — I10 Essential (primary) hypertension: Secondary | ICD-10-CM

## 2017-10-04 LAB — AMB POC HEMOGLOBIN A1C
Hemoglobin A1C, POC: 6.5 %
Hemoglobin A1c (POC): 6.5 %

## 2017-10-04 MED ORDER — ATORVASTATIN 40 MG TAB
40 mg | ORAL_TABLET | Freq: Every day | ORAL | 0 refills | Status: AC
Start: 2017-10-04 — End: ?

## 2017-10-04 MED ORDER — OMEPRAZOLE 20 MG CAP, DELAYED RELEASE
20 mg | ORAL_CAPSULE | Freq: Every day | ORAL | 0 refills | Status: AC
Start: 2017-10-04 — End: ?

## 2017-10-04 MED ORDER — AMLODIPINE-BENAZEPRIL 10 MG-40 MG CAP
10-40 mg | ORAL_CAPSULE | Freq: Every day | ORAL | 0 refills | Status: AC
Start: 2017-10-04 — End: ?

## 2017-10-04 MED ORDER — ATENOLOL 100 MG TAB
100 mg | ORAL_TABLET | Freq: Every day | ORAL | 0 refills | Status: AC
Start: 2017-10-04 — End: ?

## 2017-10-04 MED ORDER — INSULIN GLARGINE 100 UNIT/ML INJECTION
100 unit/mL | SUBCUTANEOUS | 0 refills | Status: AC
Start: 2017-10-04 — End: ?

## 2017-10-04 NOTE — ACP (Advance Care Planning) (Signed)
Advance Care Planning    Advance Care Planning  ??  Advance Care Planning (ACP) Provider Note - Comprehensive   ??  Date of ACP Conversation: 09/22/2016  Persons included in Conversation:  patient  Length of ACP Conversation in minutes:  16 minutes  ??  Authorized Management consultantDecision Maker (if patient is incapable of making informed decisions):   This person is:  not identified but he lives with his sister currently  ??  ????  General ACP for ALL Patients with Decision Making Capacity:   Importance of advance care planning, including choosing a healthcare agent to communicate patient's healthcare decisions if patient lost the ability to make decisions, such as after a sudden illness or accident  Understanding of the healthcare agent role was assessed and information provided  ??  Review of Existing Advance Directive:  none exists  ??  For Serious or Chronic Illness:  Understanding of medical condition    ??  Interventions Provided:  Recommended completion of Advance Directive form after review of ACP materials and conversation with prospective healthcare agent   Recommended review of completed ACP document annually or upon change in health status  ??

## 2017-10-04 NOTE — Progress Notes (Signed)
SUBJECTIVE  Chief Complaint   Patient presents with   ??? Leg Pain     back of knee that radiate down calf   ??? Diabetes     Mr. Sean CootsMichael [name] is a 57 y.o. year old male, he is seen today for follow-up. He is moving in 1 week to [place].      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  He is on Arixtra.    He has been checking his blood sugars which are in the low 100s range.  He has changed his diet a lot due to the diabetes.  He is taking Lantus 60U at night.  He used to be on metformin and Januvia several months.  He is under the care of endocrinology and overdue.  They will not refill his Lantus unless he sees them.     He has been largely noncompliant in various ways with follow-up and following treatment plans.    He has not done CRCS and he never had his elevated PSA checked out with the referral we performed.      OBJECTIVE    Blood pressure 130/80, pulse 90, temperature 99.1 ??F (37.3 ??C), temperature source Oral, resp. rate 16, height 6\' 5"  (1.956 m), weight 327 lb (148.3 kg), SpO2 98 %.  General:  Alert, cooperative, well appearing, in no apparent distress.  Eyes:  Pupils are equally round and reactive to light with accommodation.  The extra-ocular movements are intact.  The lids are without swelling, lesions, or drainage.  The conjunctiva are clear and noninjected.  ENT:    The tongue and mucous membranes are pink and moist without lesions.   Neck:  The thyroid is normal size without nodules or masses.  There is no lymphadenopathy.  There are no carotid bruits.  CV:  The heart sounds are regular in rate and rhythm.  There is a normal S1 and S2.   Lungs:  Inspiratory and expiratory efforts are full and unlabored.  Lung sounds are clear and equal to auscultation throughout all lung fields without wheezing, rales, or rhonchi.  Skin:  No pedal rashes, no jaundice.  Psych: normal affect.  Mood good.  Oriented x 3.  Judgement and insight intact.     Results for orders placed or performed  in visit on 10/04/17   AMB POC HEMOGLOBIN A1C   Result Value Ref Range    Hemoglobin A1c (POC) 6.5 %     ASSESSMENT / PLAN    ICD-10-CM ICD-9-CM    1. Essential hypertension I10 401.9 amLODIPine-benazepril (LOTREL) 10-40 mg per capsule      atenolol (TENORMIN) 100 mg tablet   2. Hypercholesterolemia E78.00 272.0 atorvastatin (LIPITOR) 40 mg tablet   3. Type 2 diabetes mellitus without complication, with long-term current use of insulin (HCC) E11.9 250.00 AMB POC HEMOGLOBIN A1C    Z79.4 V58.67    4. Gastroesophageal reflux disease without esophagitis K21.9 530.81 omeprazole (PRILOSEC) 20 mg capsule   5. Deep vein thrombosis (DVT) of proximal lower extremity, unspecified chronicity, unspecified laterality (HCC) I82.4Y9 453.41    6. Obesity, morbid (HCC) E66.01 278.01    7. Elevated PSA R97.20 790.93    8. Polyp of colon, unspecified part of colon, unspecified type K63.5 211.3      HTN - controlled.  Refill sent.  DASH dieting.    DM with microalbuminuria - A1c reviewed.  Refills of Lantus.   Cont ACE inhibitor.   Cont current meds.  Cont  to try to motivate to have eyes checked annually.      Hypercholesterolemia -  Advised to cont statin. No hepatotoxicity.     GERD - He is advised again that he is overdue for colonoscopy. He has been aware.     DVT - cont per hematology.      Obesity - exercise is limited.  Cont dieting.    Elevated PSA / colon polyps - refered to urology but did not go admittedly.  He is urged to get this done immediately when he moves.  He is given a letter with recommendations to show his next doctor and for his recollection.      All chart history elements were reviewed by me at the time of the visit even though marked at time of note closure. Patient understands our medical plan. Patient has provided input and agrees with goals. Alternatives have been explained and offered.  All questions answered.  The patient is to call if condition worsens or fails to improve.     Follow-up and Dispositions     ?? Return with new PCP in [place], [place].

## 2017-10-04 NOTE — Progress Notes (Signed)
Chief Complaint   Patient presents with   ??? Annual Wellness Visit     This is the Subsequent Medicare Annual Wellness Exam, performed 12 months or more after the Initial AWV or the last Subsequent AWV    I have reviewed the patient's medical history in detail and updated the computerized patient record.     History     Past Medical History:   Diagnosis Date   ??? Cyst of left kidney     referred to urology   ??? DVT (deep venous thrombosis) ([place])    ??? GERD (gastroesophageal reflux disease)    ??? 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 H/O echocardiogram 10/2015    [facility] - unchanged from 2014, EF 55%, LVH, mild aortic regurge, mild diastolic dysfunction   ??? H/O esophagogastroduodenoscopy 11/16/11    Dr. Sula Rumple; normal stomach and duodenum; hiatal hernia in the cardia   ??? High cholesterol    ??? History of stress test 06/2012    [facility] - negative stress ECHO   ??? Hypertension    ??? Prediabetes    ??? S/P colonoscopy with polypectomy 11/16/11    Dr. Sula Rumple; polyps x 3; otherwise normal colonoscopy; f/u with gastro in 1 year   ??? Thromboembolus ([place])     last one 5 to 6 months ago   ??? Unspecified sleep apnea     no CPAP      Past Surgical History:   Procedure Laterality Date   ??? ABDOMEN SURGERY PROC UNLISTED      ulcer in stomach removed   ??? HX OTHER SURGICAL  3 years ago    Port A Cath, inserted and removed     Current Outpatient Medications   Medication Sig Dispense Refill   ??? fondaparinux (ARIXTRA) 10 mg/0.8 mL syrg 0.8 mg by SubCUTAneous route two (2) times a day.     ??? glucose (TRUEPLUS GLUCOSE) 4 gram chewable tablet Take 15 g by mouth. Take 1-5 Tabs by Mouth Take As Needed (Treatment initiated when BG LESS THAN 85m/dl) for up to 30 days.     ??? glucose blood VI test strips (ASCENSIA AUTODISC VI, ONE TOUCH ULTRA TEST VI) strip Please specify Brand One Touch Ultra 2 or One Touch UltraMini blood glucose meters).  ICD 10 code : E11 Type 2 Diabetes  Is patient on insulin: Yes Z279.4 Long Term (current) Insulin Use     ??? glucose blood  VI test strips (ASCENSIA AUTODISC VI, ONE TOUCH ULTRA TEST VI) strip Check fasting and 1 hour after eating as needed 1 Package 11   ??? Blood-Glucose Meter monitoring kit Use as directed (Patient taking differently: One Touch Ultra 2 DX: E11.9) 1 kit 0   ??? Lancets misc Use as directed (Patient taking differently: One Touch Delica Lancets EV76.1Type 2 Diabetes) 1 Package 11   ??? amLODIPine-benazepril (LOTREL) 10-40 mg per capsule Take 1 Cap by mouth daily. 90 Cap 0   ??? atenolol (TENORMIN) 100 mg tablet Take 1 Tab by mouth daily. 90 Tab 0   ??? omeprazole (PRILOSEC) 20 mg capsule Take 1 Cap by mouth daily. 90 Cap 0   ??? atorvastatin (LIPITOR) 40 mg tablet Take 1 Tab by mouth daily. 90 Tab 0   ??? insulin glargine (LANTUS U-100 INSULIN) 100 unit/mL injection Take 60U SC daily. 2 Vial 0     No Known Allergies  Family History   Problem Relation Age of Onset   ??? Heart Attack Mother    ???  Hypertension Mother    ??? Arthritis-rheumatoid Mother    ??? Heart Failure Mother    ??? High Cholesterol Mother    ??? Hypertension Father    ??? Alcohol abuse Father    ??? Other Father         Tobacco Use     Social History     Tobacco Use   ??? Smoking status: Current Every Day Smoker     Packs/day: 0.80     Years: 15.00     Pack years: 12.00     Types: Cigarettes   ??? Smokeless tobacco: Never Used   Substance Use Topics   ??? Alcohol use: No     Patient Active Problem List   Diagnosis Code   ??? HTN (hypertension) I10   ??? DVT (deep venous thrombosis) (HCC) I82.409   ??? Hypercholesterolemia E78.00   ??? Advance care planning Z71.89   ??? Medically noncompliant Z91.19   ??? Gastroesophageal reflux disease K21.9   ??? Obesity, morbid ([place]) E66.01   ??? 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 Type 2 diabetes mellitus without complication, with long-term current use of insulin (HCC) E11.9, Z79.4   ??? Abnormal EKG R94.31   ??? Pre-operative cardiovascular examination Z01.810       Depression Risk Factor Screening:     3 most recent  PHQ Screens 10/04/2017   Little interest or pleasure in doing things Not at all   Feeling down, depressed, irritable, or hopeless Not at all   Total Score PHQ 2 0   Trouble falling or staying asleep, or sleeping too much -   Feeling tired or having little energy -   Poor appetite, weight loss, or overeating -   Feeling bad about yourself - or that you are a failure or have let yourself or your family down -   Trouble concentrating on things such as school, work, reading, or watching TV -   Moving or speaking so slowly that other people could have noticed; or the opposite being so fidgety that others notice -   Thoughts of being better off dead, or hurting yourself in some way -   PHQ 9 Score -   How difficult have these problems made it for you to do your work, take care of your home and get along with others -     Alcohol Risk Factor Screening:   You do not drink alcohol or very rarely.    Functional Ability and Level of Safety:   Hearing Loss  Hearing is good.    Activities of Daily Living  The home contains: no safety equipment.  Patient does total self care    Fall Risk  Fall Risk Assessment, last 12 mths 10/04/2017   Able to walk? Yes   Fall in past 12 months? No   Fall with injury? -   Number of falls in past 12 months -   Fall Risk Score -       Abuse Screen  Patient is not abused    Cognitive Screening   Evaluation of Cognitive Function:  Has your family/caregiver stated any concerns about your memory: no  Normal    Patient Care Team   Patient Care Team:  Aris Georgia, MD (Hematology and Oncology)  Lucretia Field, MD (Gastroenterology)  Darrin Nipper, MD (Orthopedic Surgery)  Steva Colder, MD (Pain Management)  Lia Hopping, MD as Surgeon (General Surgery)  Cathren Harsh, NP (  Endocrinology)  Kristeen Mans, DPM (Podiatry)  Orene Desanctis, MD (Orthopedic Surgery)    Assessment/Plan   Education and counseling provided:  Are appropriate based on today's review and evaluation      ICD-10-CM  ICD-9-CM    1. Medicare annual wellness visit, subsequent Z00.00 V70.0    2. Advanced directives, counseling/discussion Z71.89 V65.49 ADVANCE CARE PLANNING FIRST 30 MINS   3. Screening for depression Z13.31 V79.0 DEPRESSION SCREEN ANNUAL       Age and sex specific counseling.    Screening for depression and alcoholism.     Advanced directives / end of life planning discussion.  See ACP note.    Care team updated.      Medicare recommended screening and prevention test guidelines are filled out, reviewed, and provided to patient.      Patient understands our medical plan. Patient has provided input and agrees with goals.  All questions answered.

## 2017-10-04 NOTE — Progress Notes (Signed)
1. Have you been to the ER, urgent care clinic since your last visit?  Hospitalized since your last visit?No    2. Have you seen or consulted any other health care providers outside of the [facility] since your last visit?  Include any pap smears or colon screening. No

## 2017-10-04 NOTE — Progress Notes (Signed)
3 most recent PHQ Screens 10/04/2017   Little interest or pleasure in doing things Not at all   Feeling down, depressed, irritable, or hopeless Not at all   Total Score PHQ 2 0   Trouble falling or staying asleep, or sleeping too much -   Feeling tired or having little energy -   Poor appetite, weight loss, or overeating -   Feeling bad about yourself - or that you are a failure or have let yourself or your family down -   Trouble concentrating on things such as school, work, reading, or watching TV -   Moving or speaking so slowly that other people could have noticed; or the opposite being so fidgety that others notice -   Thoughts of being better off dead, or hurting yourself in some way -   PHQ 9 Score -   How difficult have these problems made it for you to do your work, take care of your home and get along with others -     Abuse Screening Questionnaire 10/04/2017   Do you ever feel afraid of your partner? N   Are you in a relationship with someone who physically or mentally threatens you? N   Is it safe for you to go home? Y     Fall Risk Assessment, last 12 mths 10/04/2017   Able to walk? Yes   Fall in past 12 months? No   Fall with injury? -   Number of falls in past 12 months -   Fall Risk Score -     1. Have you been to the ER, urgent care clinic since your last visit?  Hospitalized since your last visit?No    2. Have you seen or consulted any other health care providers outside of the [facility] since your last visit?  Include any pap smears or colon screening. No

## 2017-10-04 NOTE — Progress Notes (Signed)
3 most recent PHQ Screens 10/04/2017   Little interest or pleasure in doing things Not at all   Feeling down, depressed, irritable, or hopeless Not at all   Total Score PHQ 2 0   Trouble falling or staying asleep, or sleeping too much -   Feeling tired or having little energy -   Poor appetite, weight loss, or overeating -   Feeling bad about yourself - or that you are a failure or have let yourself or your family down -   Trouble concentrating on things such as school, work, reading, or watching TV -   Moving or speaking so slowly that other people could have noticed; or the opposite being so fidgety that others notice -   Thoughts of being better off dead, or hurting yourself in some way -   PHQ 9 Score -   How difficult have these problems made it for you to do your work, take care of your home and get along with others -     Abuse Screening Questionnaire 10/04/2017   Do you ever feel afraid of your partner? N   Are you in a relationship with someone who physically or mentally threatens you? N   Is it safe for you to go home? Y     Fall Risk Assessment, last 12 mths 10/04/2017   Able to walk? Yes   Fall in past 12 months? No   Fall with injury? -   Number of falls in past 12 months -   Fall Risk Score -     1. Have you been to the ER, urgent care clinic since your last visit?  Hospitalized since your last visit?No    2. Have you seen or consulted any other health care providers outside of the [facility] since your last visit?  Include any pap smears or colon screening. No

## 2017-10-04 NOTE — Progress Notes (Signed)
SUBJECTIVE  Chief Complaint   Patient presents with   ??? Leg Pain     back of knee that radiate down calf   ??? Diabetes     [name] is a 57 y.o. year old male, he is seen today for follow-up. He is moving in 1 week to [place].      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  He is on Arixtra.    He has been checking his blood sugars which are in the low 100s range.  He has changed his diet a lot due to the diabetes.  He is taking Lantus 60U at night.  He used to be on metformin and Januvia several months.  He is under the care of endocrinology and overdue.  They will not refill his Lantus unless he sees them.     He has been largely noncompliant in various ways with follow-up and following treatment plans.    He has not done CRCS and he never had his elevated PSA checked out with the referral we performed.      OBJECTIVE    Blood pressure 130/80, pulse 90, temperature 99.1 ??F (37.3 ??C), temperature source Oral, resp. rate 16, height 6\' 5"  (1.956 m), weight 327 lb (148.3 kg), SpO2 98 %.  General:  Alert, cooperative, well appearing, in no apparent distress.  Eyes:  Pupils are equally round and reactive to light with accommodation.  The extra-ocular movements are intact.  The lids are without swelling, lesions, or drainage.  The conjunctiva are clear and noninjected.  ENT:    The tongue and mucous membranes are pink and moist without lesions.   Neck:  The thyroid is normal size without nodules or masses.  There is no lymphadenopathy.  There are no carotid bruits.  CV:  The heart sounds are regular in rate and rhythm.  There is a normal S1 and S2.   Lungs:  Inspiratory and expiratory efforts are full and unlabored.  Lung sounds are clear and equal to auscultation throughout all lung fields without wheezing, rales, or rhonchi.  Skin:  No pedal rashes, no jaundice.  Psych: normal affect.  Mood good.  Oriented x 3.  Judgement and insight intact.      Results for orders placed or performed in visit on 10/04/17   AMB POC HEMOGLOBIN A1C   Result Value Ref Range    Hemoglobin A1c (POC) 6.5 %     ASSESSMENT / PLAN    ICD-10-CM ICD-9-CM    1. Essential hypertension I10 401.9 amLODIPine-benazepril (LOTREL) 10-40 mg per capsule      atenolol (TENORMIN) 100 mg tablet   2. Hypercholesterolemia E78.00 272.0 atorvastatin (LIPITOR) 40 mg tablet   3. Type 2 diabetes mellitus without complication, with long-term current use of insulin (HCC) E11.9 250.00 AMB POC HEMOGLOBIN A1C    Z79.4 V58.67    4. Gastroesophageal reflux disease without esophagitis K21.9 530.81 omeprazole (PRILOSEC) 20 mg capsule   5. Deep vein thrombosis (DVT) of proximal lower extremity, unspecified chronicity, unspecified laterality (HCC) I82.4Y9 453.41    6. Obesity, morbid (HCC) E66.01 278.01    7. Elevated PSA R97.20 790.93    8. Polyp of colon, unspecified part of colon, unspecified type K63.5 211.3      HTN - controlled.  Refill sent.  DASH dieting.    DM with microalbuminuria - A1c reviewed.  Refills of Lantus.   Cont ACE inhibitor.   Cont current meds.  Cont  to try to motivate to have eyes checked annually.      Hypercholesterolemia -  Advised to cont statin. No hepatotoxicity.     GERD - He is advised again that he is overdue for colonoscopy. He has been aware.     DVT - cont per hematology.      Obesity - exercise is limited.  Cont dieting.    Elevated PSA / colon polyps - refered to urology but did not go admittedly.  He is urged to get this done immediately when he moves.  He is given a letter with recommendations to show his next doctor and for his recollection.      All chart history elements were reviewed by me at the time of the visit even though marked at time of note closure. Patient understands our medical plan. Patient has provided input and agrees with goals. Alternatives have been explained and offered.  All questions answered.   The patient is to call if condition worsens or fails to improve.     Follow-up and Dispositions    ?? Return with new PCP in [place], [place].

## 2017-10-04 NOTE — Progress Notes (Signed)
1. Have you been to the ER, urgent care clinic since your last visit?  Hospitalized since your last visit?No    2. Have you seen or consulted any other health care providers outside of the [facility] since your last visit?  Include any pap smears or colon screening. No

## 2017-10-04 NOTE — Patient Instructions (Signed)
Medicare Wellness Visit, Male    The best way to live healthy is to have a lifestyle where you eat a well-balanced diet, exercise regularly, limit alcohol use, and quit all forms of tobacco/nicotine, if applicable.   Regular preventive services are another way to keep healthy. Preventive services (vaccines, screening tests, monitoring & exams) can help personalize your care plan, which helps you manage your own care. Screening tests can find health problems at the earliest stages, when they are easiest to treat.   [facility] follows the current, evidence-based guidelines published by the [place] [employer]ventive Services Task Force (USPSTF) when recommending preventive services for our patients. Because we follow these guidelines, sometimes recommendations change over time as research supports it. (For example, a prostate screening blood test is no longer routinely recommended for men with no symptoms.)  Of course, you and your doctor may decide to screen more often for some diseases, based on your risk and co-morbidities (chronic disease you are already diagnosed with).   Preventive services for you include:  - Medicare offers their members a free annual wellness visit, which is time for you and your primary care provider to discuss and plan for your preventive service needs. Take advantage of this benefit every year!  -All adults over age 57 should receive the recommended pneumonia vaccines. Current USPSTF guidelines recommend a series of two vaccines for the best pneumonia protection.   -All adults should have a flu vaccine yearly and an ECG. All adults age 57 and older should receive a shingles vaccine once in their lifetime.    -All adults age 57-70 who are overweight should have a diabetes screening test once every three years.   -Other screening tests & preventive services for persons with diabetes include: an eye exam to screen for diabetic retinopathy, a kidney function  test, a foot exam, and stricter control over your cholesterol.   -Cardiovascular screening for adults with routine risk involves an electrocardiogram (ECG) at intervals determined by the provider.   -Colorectal cancer screening should be done for adults age 57-75 with no increased risk factors for colorectal cancer.  There are a number of acceptable methods of screening for this type of cancer. Each test has its own benefits and drawbacks. Discuss with your provider what is most appropriate for you during your annual wellness visit. The different tests include: colonoscopy (considered the best screening method), a fecal occult blood test, a fecal DNA test, and sigmoidoscopy.  -All adults born between 1945 and 1965 should be screened once for Hepatitis C.  -An Abdominal Aortic Aneurysm (AAA) Screening is recommended for men age 57-75 who has ever smoked in their lifetime.     Here is a list of your current Health Maintenance items (your personalized list of preventive services) with a due date:  Health Maintenance Due   Topic Date Due   ??? Shingles Vaccine (1 of 2) 04/19/2010   ??? Colonoscopy  11/17/2012   ??? Diabetic Foot Care  07/10/2016   ??? Flu Vaccine  09/16/2017   ??? Glaucoma Screening   09/22/2017   ??? Albumin Urine Test  09/22/2017   ??? Eye Exam  09/22/2017   ??? Hemoglobin A1C    10/03/2017   ??? Annual Well Visit  09/23/2017

## 2017-10-04 NOTE — Progress Notes (Signed)
Chief Complaint   Patient presents with   ??? Annual Wellness Visit     This is the Subsequent Medicare Annual Wellness Exam, performed 12 months or more after the Initial AWV or the last Subsequent AWV    I have reviewed the patient's medical history in detail and updated the computerized patient record.     History     Past Medical History:   Diagnosis Date   ??? Cyst of left kidney     referred to urology   ??? DVT (deep venous thrombosis) ([place])    ??? GERD (gastroesophageal reflux disease)    ??? 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 H/O echocardiogram 10/2015    [facility] - unchanged from 2014, EF 55%, LVH, mild aortic regurge, mild diastolic dysfunction   ??? H/O esophagogastroduodenoscopy 11/16/11    Dr. Sula Rumple; normal stomach and duodenum; hiatal hernia in the cardia   ??? High cholesterol    ??? History of stress test 06/2012    [facility] - negative stress ECHO   ??? Hypertension    ??? Prediabetes    ??? S/P colonoscopy with polypectomy 11/16/11    Dr. Sula Rumple; polyps x 3; otherwise normal colonoscopy; f/u with gastro in 1 year   ??? Thromboembolus ([place])     last one 5 to 6 months ago   ??? Unspecified sleep apnea     no CPAP      Past Surgical History:   Procedure Laterality Date   ??? ABDOMEN SURGERY PROC UNLISTED      ulcer in stomach removed   ??? HX OTHER SURGICAL  3 years ago    Port A Cath, inserted and removed     Current Outpatient Medications   Medication Sig Dispense Refill   ??? fondaparinux (ARIXTRA) 10 mg/0.8 mL syrg 0.8 mg by SubCUTAneous route two (2) times a day.     ??? glucose (TRUEPLUS GLUCOSE) 4 gram chewable tablet Take 15 g by mouth. Take 1-5 Tabs by Mouth Take As Needed (Treatment initiated when BG LESS THAN 48m/dl) for up to 30 days.     ??? glucose blood VI test strips (ASCENSIA AUTODISC VI, ONE TOUCH ULTRA TEST VI) strip Please specify Brand One Touch Ultra 2 or One Touch UltraMini blood glucose meters).  ICD 10 code : E11 Type 2 Diabetes  Is patient on insulin: Yes Z279.4 Long Term (current) Insulin Use      ??? glucose blood VI test strips (ASCENSIA AUTODISC VI, ONE TOUCH ULTRA TEST VI) strip Check fasting and 1 hour after eating as needed 1 Package 11   ??? Blood-Glucose Meter monitoring kit Use as directed (Patient taking differently: One Touch Ultra 2 DX: E11.9) 1 kit 0   ??? Lancets misc Use as directed (Patient taking differently: One Touch Delica Lancets EW29.5Type 2 Diabetes) 1 Package 11   ??? amLODIPine-benazepril (LOTREL) 10-40 mg per capsule Take 1 Cap by mouth daily. 90 Cap 0   ??? atenolol (TENORMIN) 100 mg tablet Take 1 Tab by mouth daily. 90 Tab 0   ??? omeprazole (PRILOSEC) 20 mg capsule Take 1 Cap by mouth daily. 90 Cap 0   ??? atorvastatin (LIPITOR) 40 mg tablet Take 1 Tab by mouth daily. 90 Tab 0   ??? insulin glargine (LANTUS U-100 INSULIN) 100 unit/mL injection Take 60U SC daily. 2 Vial 0     No Known Allergies  Family History   Problem Relation Age of Onset   ??? Heart Attack Mother    ???  Hypertension Mother    ??? Arthritis-rheumatoid Mother    ??? Heart Failure Mother    ??? High Cholesterol Mother    ??? Hypertension Father    ??? Alcohol abuse Father    ??? Other Father         Tobacco Use     Social History     Tobacco Use   ??? Smoking status: Current Every Day Smoker     Packs/day: 0.80     Years: 15.00     Pack years: 12.00     Types: Cigarettes   ??? Smokeless tobacco: Never Used   Substance Use Topics   ??? Alcohol use: No     Patient Active Problem List   Diagnosis Code   ??? HTN (hypertension) I10   ??? DVT (deep venous thrombosis) (HCC) I82.409   ??? Hypercholesterolemia E78.00   ??? Advance care planning Z71.89   ??? Medically noncompliant Z91.19   ??? Gastroesophageal reflux disease K21.9   ??? Obesity, morbid ([place]) E66.01   ??? 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 Type 2 diabetes mellitus without complication, with long-term current use of insulin (HCC) E11.9, Z79.4   ??? Abnormal EKG R94.31   ??? Pre-operative cardiovascular examination Z01.810       Depression Risk Factor Screening:      3 most recent PHQ Screens 10/04/2017   Little interest or pleasure in doing things Not at all   Feeling down, depressed, irritable, or hopeless Not at all   Total Score PHQ 2 0   Trouble falling or staying asleep, or sleeping too much -   Feeling tired or having little energy -   Poor appetite, weight loss, or overeating -   Feeling bad about yourself - or that you are a failure or have let yourself or your family down -   Trouble concentrating on things such as school, work, reading, or watching TV -   Moving or speaking so slowly that other people could have noticed; or the opposite being so fidgety that others notice -   Thoughts of being better off dead, or hurting yourself in some way -   PHQ 9 Score -   How difficult have these problems made it for you to do your work, take care of your home and get along with others -     Alcohol Risk Factor Screening:   You do not drink alcohol or very rarely.    Functional Ability and Level of Safety:   Hearing Loss  Hearing is good.    Activities of Daily Living  The home contains: no safety equipment.  Patient does total self care    Fall Risk  Fall Risk Assessment, last 12 mths 10/04/2017   Able to walk? Yes   Fall in past 12 months? No   Fall with injury? -   Number of falls in past 12 months -   Fall Risk Score -       Abuse Screen  Patient is not abused    Cognitive Screening   Evaluation of Cognitive Function:  Has your family/caregiver stated any concerns about your memory: no  Normal    Patient Care Team   Patient Care Team:  Aris Georgia, MD (Hematology and Oncology)  Lucretia Field, MD (Gastroenterology)  Darrin Nipper, MD (Orthopedic Surgery)  Steva Colder, MD (Pain Management)  Lia Hopping, MD as Surgeon (General Surgery)  Cathren Harsh, NP (  Endocrinology)  Kristeen Mans, DPM (Podiatry)  Orene Desanctis, MD (Orthopedic Surgery)    Assessment/Plan   Education and counseling provided:  Are appropriate based on today's review and evaluation       ICD-10-CM ICD-9-CM    1. Medicare annual wellness visit, subsequent Z00.00 V70.0    2. Advanced directives, counseling/discussion Z71.89 V65.49 ADVANCE CARE PLANNING FIRST 30 MINS   3. Screening for depression Z13.31 V79.0 DEPRESSION SCREEN ANNUAL       Age and sex specific counseling.    Screening for depression and alcoholism.     Advanced directives / end of life planning discussion.  See ACP note.    Care team updated.      Medicare recommended screening and prevention test guidelines are filled out, reviewed, and provided to patient.      Patient understands our medical plan. Patient has provided input and agrees with goals.  All questions answered.

## 2017-10-04 NOTE — ACP (Advance Care Planning) (Signed)
Advance Care Planning    Advance Care Planning  ??  Advance Care Planning (ACP) Provider Note - Comprehensive   ??  Date of ACP Conversation: 09/22/2016  Persons included in Conversation:  patient  Length of ACP Conversation in minutes:  16 minutes  ??  Authorized Decision Maker (if patient is incapable of making informed decisions):   This person is:  not identified but he lives with his sister currently  ??  ????  General ACP for ALL Patients with Decision Making Capacity:   Importance of advance care planning, including choosing a healthcare agent to communicate patient's healthcare decisions if patient lost the ability to make decisions, such as after a sudden illness or accident  Understanding of the healthcare agent role was assessed and information provided  ??  Review of Existing Advance Directive:  none exists  ??  For Serious or Chronic Illness:  Understanding of medical condition    ??  Interventions Provided:  Recommended completion of Advance Directive form after review of ACP materials and conversation with prospective healthcare agent   Recommended review of completed ACP document annually or upon change in health status  ??

## 2017-10-13 ENCOUNTER — Encounter: Attending: Hematology & Oncology

## 2018-06-27 ENCOUNTER — Encounter

## 2018-06-27 NOTE — Telephone Encounter (Signed)
Called [phone] (home)  but there was no answer and VM not set up. Called [phone] and LMOV for patient to call [facility]. He was advised if Dr. Maryruth Bun is his PCP then a virtual visit is needed for refills.  If not, then he needs to contact his new PCP to request refills.

## 2018-06-27 NOTE — Telephone Encounter (Signed)
Not sure if patient is under our care since he was moving.  If so, I can do a virtual visit to maintain refills that we managed.

## 2018-08-18 ENCOUNTER — Emergency Department (HOSPITAL_BASED_OUTPATIENT_CLINIC_OR_DEPARTMENT_OTHER)
Admission: EM | Admit: 2018-08-18 | Discharge: 2018-08-18 | Disposition: A | Discharge status: Home / Self Care | Payer: Medicare Other | Attending: Emergency Medicine | Admitting: Emergency Medicine

## 2018-08-18 ENCOUNTER — Other Ambulatory Visit: Payer: Self-pay

## 2018-08-18 ENCOUNTER — Encounter (HOSPITAL_BASED_OUTPATIENT_CLINIC_OR_DEPARTMENT_OTHER): Payer: Self-pay | Admitting: *Deleted

## 2018-08-18 ENCOUNTER — Emergency Department (HOSPITAL_BASED_OUTPATIENT_CLINIC_OR_DEPARTMENT_OTHER): Payer: Medicare Other

## 2018-08-18 DIAGNOSIS — F17210 Nicotine dependence, cigarettes, uncomplicated: Secondary | ICD-10-CM | POA: Insufficient documentation

## 2018-08-18 DIAGNOSIS — Z794 Long term (current) use of insulin: Secondary | ICD-10-CM | POA: Diagnosis not present

## 2018-08-18 DIAGNOSIS — Z20828 Contact with and (suspected) exposure to other viral communicable diseases: Secondary | ICD-10-CM | POA: Insufficient documentation

## 2018-08-18 DIAGNOSIS — R739 Hyperglycemia, unspecified: Secondary | ICD-10-CM

## 2018-08-18 DIAGNOSIS — E1165 Type 2 diabetes mellitus with hyperglycemia: Secondary | ICD-10-CM | POA: Insufficient documentation

## 2018-08-18 DIAGNOSIS — N179 Acute kidney failure, unspecified: Secondary | ICD-10-CM | POA: Diagnosis not present

## 2018-08-18 DIAGNOSIS — I10 Essential (primary) hypertension: Secondary | ICD-10-CM | POA: Diagnosis not present

## 2018-08-18 DIAGNOSIS — Z79899 Other long term (current) drug therapy: Secondary | ICD-10-CM | POA: Insufficient documentation

## 2018-08-18 HISTORY — DX: Essential (primary) hypertension: I10

## 2018-08-18 HISTORY — DX: Type 2 diabetes mellitus without complications: E11.9

## 2018-08-18 LAB — COMPREHENSIVE METABOLIC PANEL
ALT: 29 U/L (ref 0–44)
AST: 21 U/L (ref 15–41)
Albumin: 4.4 g/dL (ref 3.5–5.0)
Alkaline Phosphatase: 145 U/L — ABNORMAL HIGH (ref 38–126)
Anion gap: 16 — ABNORMAL HIGH (ref 5–15)
BUN: 26 mg/dL — ABNORMAL HIGH (ref 6–20)
CO2: 22 mmol/L (ref 22–32)
Calcium: 9.6 mg/dL (ref 8.9–10.3)
Chloride: 88 mmol/L — ABNORMAL LOW (ref 98–111)
Creatinine, Ser: 1.93 mg/dL — ABNORMAL HIGH (ref 0.61–1.24)
GFR calc Af Amer: 43 mL/min — ABNORMAL LOW (ref >60)
GFR calc non Af Amer: 37 mL/min — ABNORMAL LOW (ref >60)
Glucose, Bld: 668 mg/dL (ref 70–99)
Potassium: 4 mmol/L (ref 3.5–5.1)
Sodium: 126 mmol/L — ABNORMAL LOW (ref 135–145)
Total Bilirubin: 1.1 mg/dL (ref 0.3–1.2)
Total Protein: 8.6 g/dL — ABNORMAL HIGH (ref 6.5–8.1)

## 2018-08-18 LAB — URINALYSIS, MICROSCOPIC (REFLEX)

## 2018-08-18 LAB — CBC WITH DIFFERENTIAL/PLATELET
Abs Immature Granulocytes: 0.03 10*3/uL (ref 0.00–0.07)
Basophils Absolute: 0.1 10*3/uL (ref 0.0–0.1)
Basophils Relative: 1 %
Eosinophils Absolute: 0.1 10*3/uL (ref 0.0–0.5)
Eosinophils Relative: 1 %
HCT: 47.6 % (ref 39.0–52.0)
Hemoglobin: 16 g/dL (ref 13.0–17.0)
Immature Granulocytes: 0 %
Lymphocytes Relative: 27 %
Lymphs Abs: 3 10*3/uL (ref 0.7–4.0)
MCH: 26.4 pg (ref 26.0–34.0)
MCHC: 33.6 g/dL (ref 30.0–36.0)
MCV: 78.5 fL — ABNORMAL LOW (ref 80.0–100.0)
Monocytes Absolute: 0.7 10*3/uL (ref 0.1–1.0)
Monocytes Relative: 7 %
Neutro Abs: 7.1 10*3/uL (ref 1.7–7.7)
Neutrophils Relative %: 64 %
Platelets: 225 10*3/uL (ref 150–400)
RBC: 6.06 MIL/uL — ABNORMAL HIGH (ref 4.22–5.81)
RDW: 15.4 % (ref 11.5–15.5)
WBC: 10.9 10*3/uL — ABNORMAL HIGH (ref 4.0–10.5)
nRBC: 0 % (ref 0.0–0.2)

## 2018-08-18 LAB — POCT I-STAT EG7
Acid-Base Excess: 5 mmol/L — ABNORMAL HIGH (ref 0.0–2.0)
Bicarbonate: 29.5 mmol/L — ABNORMAL HIGH (ref 20.0–28.0)
Calcium, Ion: 1.11 mmol/L — ABNORMAL LOW (ref 1.15–1.40)
HCT: 52 % (ref 39.0–52.0)
Hemoglobin: 17.7 g/dL — ABNORMAL HIGH (ref 13.0–17.0)
O2 Saturation: 89 %
Patient temperature: 99.7
Potassium: 4.5 mmol/L (ref 3.5–5.1)
Sodium: 127 mmol/L — ABNORMAL LOW (ref 135–145)
TCO2: 31 mmol/L (ref 22–32)
pCO2, Ven: 42.4 mmHg — ABNORMAL LOW (ref 44.0–60.0)
pH, Ven: 7.453 — ABNORMAL HIGH (ref 7.250–7.430)
pO2, Ven: 55 mmHg — ABNORMAL HIGH (ref 32.0–45.0)

## 2018-08-18 LAB — URINALYSIS, ROUTINE W REFLEX MICROSCOPIC
Bilirubin Urine: NEGATIVE
Glucose, UA: >=500 mg/dL — AB
Ketones, ur: NEGATIVE mg/dL
Leukocytes,Ua: NEGATIVE
Nitrite: NEGATIVE
Protein, ur: NEGATIVE mg/dL
Specific Gravity, Urine: 1.01 (ref 1.005–1.030)
pH: 6 (ref 5.0–8.0)

## 2018-08-18 LAB — CBG MONITORING, ED
Glucose-Capillary: >600 mg/dL (ref 70–99)
Glucose-Capillary: 567 mg/dL (ref 70–99)

## 2018-08-18 MED ORDER — INSULIN ASPART 100 UNIT/ML ~~LOC~~ SOLN
20.0000 [IU] | Freq: Once | SUBCUTANEOUS | Status: AC
  Administered 2018-08-18: 20 [IU] via SUBCUTANEOUS
  Filled 2018-08-18: qty 1

## 2018-08-18 MED ORDER — LACTATED RINGERS IV BOLUS
1000.0000 mL | Freq: Once | INTRAVENOUS | Status: AC
  Administered 2018-08-18: 1000 mL via INTRAVENOUS

## 2018-08-18 MED ORDER — ACETAMINOPHEN 325 MG PO TABS: 650.0000 mg | ORAL_TABLET | Freq: Once | ORAL | Status: DC

## 2018-08-18 NOTE — ED Provider Notes (Signed)
[facility] EMERGENCY DEPARTMENT Provider Note   CSN: [id number] Arrival date & time: 08/18/18  1705    History   Chief Complaint Chief Complaint  Patient presents with  . Hyperglycemia    HPI Brett Barnes is a 58 y.o. male.     HPI  58 year old male presents with hyperglycemia.  He was running out of his insulin and was stretching it out.  He has not had his glucometer strips so he has not been checking his sugar.  Over the last few days he is been feeling weak, thirsty, excessive urination and blurry vision.  Feels like prior hypoglycemia episodes.  He specifically denies fever, cough, shortness of breath, headache, vomiting or pain.  He is noted to have a temp of 100.4 here but he did not know of a fever at home and thinks it might be because of his mask. Feels overall weak.  Past Medical History:  Diagnosis Date  . Diabetes mellitus without complication ([place])   . Hypertension     There are no active problems to display for this patient.   History reviewed. No pertinent surgical history.      Home Medications    Prior to Admission medications   Medication Sig Start Date End Date Taking? Authorizing Provider  amLODipine-benazepril (LOTREL) 10-40 MG capsule Take by mouth. 06/24/12  Yes [provider]  atorvastatin (LIPITOR) 40 MG tablet Take by mouth. 10/04/17  Yes [provider]  fluticasone (FLONASE) 50 MCG/ACT nasal spray 1 Spray by each nostril route Once a Day. 03/30/17  Yes [provider]  furosemide (LASIX) 20 MG tablet Take by mouth. 02/10/18  Yes [provider]  hydrochlorothiazide (HYDRODIURIL) 25 MG tablet TAKE 1 TABLET BY MOUTH DAILY 02/10/18  Yes [provider]  insulin glargine (LANTUS) 100 UNIT/ML injection Inject into the skin. 10/04/17  Yes [provider]  omeprazole (PRILOSEC) 20 MG capsule Take by mouth. 07/15/12  Yes [provider]  valsartan (DIOVAN) 160 MG tablet Take by  mouth. 02/10/18  Yes [provider]    Family History History reviewed. No pertinent family history.  Social History Social History   Tobacco Use  . Smoking status: Current Every Day Smoker    Packs/day: 0.50    Types: Cigarettes  . Smokeless tobacco: Never Used  Substance Use Topics  . Alcohol use: Not Currently  . Drug use: Not Currently     Allergies   Patient has no known allergies.   Review of Systems Review of Systems  Constitutional: Positive for fatigue. Negative for fever.  Respiratory: Negative for cough and shortness of breath.   Cardiovascular: Negative for chest pain.  Gastrointestinal: Negative for abdominal pain and vomiting.  Endocrine: Positive for polydipsia and polyuria.  Genitourinary: Negative for dysuria.  Neurological: Positive for weakness.  All other systems reviewed and are negative.    Physical Exam Updated Vital Signs BP 129/89   Pulse 96   Temp 99.8 F (37.7 C) (Oral)   Resp 13   Ht 6\' 5"  (1.956 m)   Wt (!) 149.7 kg   SpO2 97%   BMI 39.13 kg/m   Physical Exam Vitals signs and nursing note reviewed.  Constitutional:      Appearance: He is well-developed. He is obese.  HENT:     Head: Normocephalic and atraumatic.     Right Ear: External ear normal.     Left Ear: External ear normal.     Nose: Nose normal.  Eyes:  General:        Right eye: No discharge.        Left eye: No discharge.  Neck:     Musculoskeletal: Neck supple.  Cardiovascular:     Rate and Rhythm: Regular rhythm. Tachycardia present.     Heart sounds: Normal heart sounds.  Pulmonary:     Effort: Pulmonary effort is normal.     Breath sounds: Normal breath sounds. No wheezing, rhonchi or rales.  Abdominal:     Palpations: Abdomen is soft.     Tenderness: There is no abdominal tenderness.  Skin:    General: Skin is warm and dry.  Neurological:     Mental Status: He is alert.  Psychiatric:        Mood and Affect: Mood is not anxious.       ED Treatments / Results  Labs (all labs ordered are listed, but only abnormal results are displayed) Labs Reviewed  CBC WITH DIFFERENTIAL/PLATELET - Abnormal; Notable for the following components:      Result Value   WBC 10.9 (*)    RBC 6.06 (*)    MCV 78.5 (*)    All other components within normal limits  COMPREHENSIVE METABOLIC PANEL - Abnormal; Notable for the following components:   Sodium 126 (*)    Chloride 88 (*)    Glucose, Bld 668 (*)    BUN 26 (*)    Creatinine, Ser 1.93 (*)    Total Protein 8.6 (*)    Alkaline Phosphatase 145 (*)    GFR calc non Af Amer 37 (*)    GFR calc Af Amer 43 (*)    Anion gap 16 (*)    All other components within normal limits  URINALYSIS, ROUTINE W REFLEX MICROSCOPIC - Abnormal; Notable for the following components:   Glucose, UA >=500 (*)    Hgb urine dipstick MODERATE (*)    All other components within normal limits  URINALYSIS, MICROSCOPIC (REFLEX) - Abnormal; Notable for the following components:   Bacteria, UA RARE (*)    All other components within normal limits  CBG MONITORING, ED - Abnormal; Notable for the following components:   Glucose-Capillary >600 (*)    All other components within normal limits  CBG MONITORING, ED - Abnormal; Notable for the following components:   Glucose-Capillary 567 (*)    All other components within normal limits  POCT I-STAT EG7 - Abnormal; Notable for the following components:   pH, Ven 7.453 (*)    pCO2, Ven 42.4 (*)    pO2, Ven 55.0 (*)    Bicarbonate 29.5 (*)    Acid-Base Excess 5.0 (*)    Sodium 127 (*)    Calcium, Ion 1.11 (*)    Hemoglobin 17.7 (*)    All other components within normal limits  SARS CORONAVIRUS 2 (HOSP ORDER, PERFORMED IN [facility] VIA [facility]BBOTT ID)  CULTURE, BLOOD (ROUTINE X 2)  CULTURE, BLOOD (ROUTINE X 2)  I-STAT VENOUS BLOOD GAS, ED  CBG MONITORING, ED    EKG EKG Interpretation  Date/Time:  Thursday August 18 2018 17:18:57 EDT Ventricular Rate:  115 PR  Interval:    QRS Duration: 102 QT Interval:  355 QTC Calculation: 491 R Axis:   25 Text Interpretation:  Sinus tachycardia Probable left atrial enlargement Borderline prolonged QT interval No old tracing to compare Confirmed by Pricilla LovelessGoldston, Daneka Lantigua [phone]([id number]) on 08/18/2018 5:38:51 PM   Radiology Dg Chest Portable 1 View  Result Date: 08/18/2018 CLINICAL DATA:  Fever  and weakness. EXAM: PORTABLE CHEST 1 VIEW COMPARISON:  None. FINDINGS: Bibasilar atelectasis. The lungs are otherwise clear. Normal cardiomediastinal contours. No pleural effusion or pneumothorax. IMPRESSION: No active disease. Electronically Signed   By: Deatra RobinsonKevin  Herman M.D.   On: 08/18/2018 18:06    Procedures Procedures (including critical care time)  Medications Ordered in ED Medications  lactated ringers bolus 1,000 mL (0 mLs Intravenous Stopped 08/18/18 2035)  lactated ringers bolus 1,000 mL (0 mLs Intravenous Stopped 08/18/18 2035)  insulin aspart (novoLOG) injection 20 Units (20 Units Subcutaneous Given 08/18/18 2038)     Initial Impression / Assessment and Plan / ED Course  I have reviewed the triage vital signs and the nursing notes.  Pertinent labs & imaging results that were available during my care of the patient were reviewed by me and considered in my medical decision making (see chart for details).        Originally start infectious work-up but the patient refuses because he states he does not truly have a fever.  When it was rechecked with no treatments, temp is down to 99.8.  He thinks is from the mask and other environmental factors.  It may be.  His glucose is very high and he has evidence of acute kidney injury but his pH is okay.  Mild anion gap is probably dehydration more than DKA.  No ketones in the urine.  I recommended he come in for fluids and insulin drip but he declines.  He states he is feeling so much better after fluids he wants to go home.  He does understand possible deleterious effects of non-treated or  undertreated kidney failure.  He understands return precautions.  I will give him subcutaneous insulin now that his glucose is a little better.  Offered a third liter of fluid but he declines.  Brett Barnes was evaluated in Emergency Department on 08/18/2018 for the symptoms described in the history of present illness. He was evaluated in the context of the global COVID-19 pandemic, which necessitated consideration that the patient might be at risk for infection with the SARS-CoV-2 virus that causes COVID-19. Institutional protocols and algorithms that pertain to the evaluation of patients at risk for COVID-19 are in a state of rapid change based on information released by regulatory bodies including the CDC and federal and state organizations. These policies and algorithms were followed during the patient's care in the ED.   Final Clinical Impressions(s) / ED Diagnoses   Final diagnoses:  Hyperglycemia  Acute kidney injury [facility](HCC)    ED Discharge Orders    None       Pricilla LovelessGoldston, Jonnie Kubly, MD 08/18/18 2043

## 2018-08-18 NOTE — ED Notes (Addendum)
Pt refusing SARS-CoV2 and rectal temp. Pt states he doesn't want any tests related to infection. He is certain the only problem with him is due to his high blood sugar.

## 2018-08-18 NOTE — Discharge Instructions (Addendum)
Your lab work shows very high glucose today and a kidney injury.  It was recommended you come into the hospital for fluids and insulin.  If you change your mind or any of your symptoms become new or worse than you should return to the ER for evaluation.  Otherwise take your medicines as prescribed and follow-up closely with your doctor.

## 2018-08-18 NOTE — ED Notes (Signed)
Pt adamant about wanting to leave now. Pt not wanting to wait for recheck of CBG. EDP aware.

## 2018-08-18 NOTE — ED Triage Notes (Signed)
pt c/o increased blood sugar , out of meds x 2 days , blurred vision and increased thirst and urination

## 2018-09-19 ENCOUNTER — Encounter (HOSPITAL_BASED_OUTPATIENT_CLINIC_OR_DEPARTMENT_OTHER): Payer: Self-pay

## 2018-09-19 ENCOUNTER — Inpatient Hospital Stay (HOSPITAL_BASED_OUTPATIENT_CLINIC_OR_DEPARTMENT_OTHER)
Admission: EM | Admit: 2018-09-19 | Discharge: 2018-09-19 | DRG: 639 | Disposition: A | Discharge status: Home / Self Care | Payer: Medicare Other | Attending: Emergency Medicine | Admitting: Emergency Medicine

## 2018-09-19 ENCOUNTER — Other Ambulatory Visit: Payer: Self-pay

## 2018-09-19 DIAGNOSIS — F17210 Nicotine dependence, cigarettes, uncomplicated: Secondary | ICD-10-CM | POA: Diagnosis not present

## 2018-09-19 DIAGNOSIS — E1110 Type 2 diabetes mellitus with ketoacidosis without coma: Secondary | ICD-10-CM | POA: Diagnosis present

## 2018-09-19 DIAGNOSIS — Z79899 Other long term (current) drug therapy: Secondary | ICD-10-CM

## 2018-09-19 DIAGNOSIS — E0810 Diabetes mellitus due to underlying condition with ketoacidosis without coma: Secondary | ICD-10-CM

## 2018-09-19 DIAGNOSIS — Z9111 Patient's noncompliance with dietary regimen: Secondary | ICD-10-CM | POA: Diagnosis not present

## 2018-09-19 DIAGNOSIS — Z794 Long term (current) use of insulin: Secondary | ICD-10-CM | POA: Diagnosis not present

## 2018-09-19 DIAGNOSIS — E860 Dehydration: Secondary | ICD-10-CM

## 2018-09-19 LAB — COMPREHENSIVE METABOLIC PANEL
ALT: 36 U/L (ref 0–44)
ALT: 34 U/L (ref 0–44)
AST: 26 U/L (ref 15–41)
AST: 21 U/L (ref 15–41)
Albumin: 4.5 g/dL (ref 3.5–5.0)
Albumin: 4.2 g/dL (ref 3.5–5.0)
Alkaline Phosphatase: 152 U/L — ABNORMAL HIGH (ref 38–126)
Alkaline Phosphatase: 129 U/L — ABNORMAL HIGH (ref 38–126)
Anion gap: 20 — ABNORMAL HIGH (ref 5–15)
Anion gap: 14 (ref 5–15)
BUN: 27 mg/dL — ABNORMAL HIGH (ref 6–20)
BUN: 22 mg/dL — ABNORMAL HIGH (ref 6–20)
CO2: 21 mmol/L — ABNORMAL LOW (ref 22–32)
CO2: 23 mmol/L (ref 22–32)
Calcium: 10.6 mg/dL — ABNORMAL HIGH (ref 8.9–10.3)
Calcium: 10.2 mg/dL (ref 8.9–10.3)
Chloride: 90 mmol/L — ABNORMAL LOW (ref 98–111)
Chloride: 98 mmol/L (ref 98–111)
Creatinine, Ser: 1.69 mg/dL — ABNORMAL HIGH (ref 0.61–1.24)
Creatinine, Ser: 1.31 mg/dL — ABNORMAL HIGH (ref 0.61–1.24)
GFR calc Af Amer: 51 mL/min — ABNORMAL LOW (ref >60)
GFR calc Af Amer: >60 mL/min (ref >60)
GFR calc non Af Amer: 44 mL/min — ABNORMAL LOW (ref >60)
GFR calc non Af Amer: 60 mL/min — ABNORMAL LOW (ref >60)
Glucose, Bld: 720 mg/dL (ref 70–99)
Glucose, Bld: 396 mg/dL — ABNORMAL HIGH (ref 70–99)
Potassium: 4.1 mmol/L (ref 3.5–5.1)
Potassium: 3.4 mmol/L — ABNORMAL LOW (ref 3.5–5.1)
Sodium: 131 mmol/L — ABNORMAL LOW (ref 135–145)
Sodium: 135 mmol/L (ref 135–145)
Total Bilirubin: 1.4 mg/dL — ABNORMAL HIGH (ref 0.3–1.2)
Total Bilirubin: 0.8 mg/dL (ref 0.3–1.2)
Total Protein: 9 g/dL — ABNORMAL HIGH (ref 6.5–8.1)
Total Protein: 8.3 g/dL — ABNORMAL HIGH (ref 6.5–8.1)

## 2018-09-19 LAB — CBG MONITORING, ED
Glucose-Capillary: >600 mg/dL (ref 70–99)
Glucose-Capillary: 433 mg/dL — ABNORMAL HIGH (ref 70–99)
Glucose-Capillary: 354 mg/dL — ABNORMAL HIGH (ref 70–99)

## 2018-09-19 LAB — URINALYSIS, MICROSCOPIC (REFLEX)

## 2018-09-19 LAB — CBC
HCT: 51.3 % (ref 39.0–52.0)
Hemoglobin: 16.8 g/dL (ref 13.0–17.0)
MCH: 26.2 pg (ref 26.0–34.0)
MCHC: 32.7 g/dL (ref 30.0–36.0)
MCV: 79.9 fL — ABNORMAL LOW (ref 80.0–100.0)
Platelets: 186 10*3/uL (ref 150–400)
RBC: 6.42 MIL/uL — ABNORMAL HIGH (ref 4.22–5.81)
RDW: 16.9 % — ABNORMAL HIGH (ref 11.5–15.5)
WBC: 10.4 10*3/uL (ref 4.0–10.5)
nRBC: 0 % (ref 0.0–0.2)

## 2018-09-19 LAB — URINALYSIS, ROUTINE W REFLEX MICROSCOPIC
Bilirubin Urine: NEGATIVE
Glucose, UA: >=500 mg/dL — AB
Ketones, ur: 15 mg/dL — AB
Leukocytes,Ua: NEGATIVE
Nitrite: NEGATIVE
Protein, ur: 30 mg/dL — AB
Specific Gravity, Urine: 1.015 (ref 1.005–1.030)
pH: 6 (ref 5.0–8.0)

## 2018-09-19 MED ORDER — SODIUM CHLORIDE 0.9 % IV BOLUS
1000.0000 mL | Freq: Once | INTRAVENOUS | Status: AC
  Administered 2018-09-19: 1000 mL via INTRAVENOUS

## 2018-09-19 MED ORDER — SODIUM CHLORIDE 0.9 % IV BOLUS
1000.0000 mL | Freq: Once | INTRAVENOUS | Status: AC
  Administered 2018-09-19: 13:00:00 1000 mL via INTRAVENOUS

## 2018-09-19 MED ORDER — SODIUM CHLORIDE 0.9 % IV SOLN
INTRAVENOUS | Status: DC | PRN
  Administered 2018-09-19: 250 mL via INTRAVENOUS

## 2018-09-19 MED ORDER — DEXTROSE-NACL 5-0.45 % IV SOLN: INTRAVENOUS | Status: DC

## 2018-09-19 MED ORDER — INSULIN REGULAR(HUMAN) IN NACL 100-0.9 UT/100ML-% IV SOLN
INTRAVENOUS | Status: DC
  Administered 2018-09-19: 5.4 [IU]/h via INTRAVENOUS
  Filled 2018-09-19: qty 100

## 2018-09-19 NOTE — ED Notes (Addendum)
Pt refused SARS Coronavirus 2 test for admission; EDP notified

## 2018-09-19 NOTE — ED Triage Notes (Signed)
Pt c/o elevated BS x 2-3 days-reading "high"-c/o fatigue and vision issues-states "just like it was last time when my sugar was this high"-NAD-steady gait

## 2018-09-19 NOTE — ED Notes (Signed)
Ice chips provided per ok by EDP

## 2018-09-19 NOTE — ED Provider Notes (Signed)
Patient care assumed at 1500. Patient with history of diabetes here for evaluation of hyperglycemia. Admission for DKA was recommended but patient refuses admission. He states that he is compliant with his medications but has been drinking a lot of juice. Repeat BMP is improving, with improved bicarb and glucose. Plan to discharge home with continued medications. Counseled patient on diabetic diet and home care. Return precautions discussed.   Quintella Reichert, MD 09/19/18 1744

## 2018-09-19 NOTE — ED Notes (Signed)
Pt on monitor and RN Santiago Glad informed of CBG reading of over 600

## 2018-09-19 NOTE — ED Provider Notes (Addendum)
[facility] EMERGENCY DEPARTMENT Provider Note   CSN: [id number] Arrival date & time: 09/19/18  1147     History   Chief Complaint Chief Complaint  Patient presents with  . Hyperglycemia    HPI Brett Barnes is a 58 y.o. male.     Patient with hx dm, c/o blood sugar being high for past few days. Symptoms acute onset, moder-sev, persistent, worse today. Blood sugar reads > 600. Pt also notes polyuria and polydipsia. No vomiting. Denies abd pain or diarrhea. No chest pain or discomfort. No sob. Denies cough or uri symptoms. No dysuria. States compliant w home meds. Does not follow diabetic meal plan, and has been drinking a lot of juices.   The history is provided by the patient.  Hyperglycemia Associated symptoms: increased thirst and polyuria   Associated symptoms: no abdominal pain, no chest pain, no confusion, no dysuria, no fever, no shortness of breath and no vomiting     Past Medical History:  Diagnosis Date  . Diabetes mellitus without complication ([place])   . Hypertension     There are no active problems to display for this patient.   Past Surgical History:  Procedure Laterality Date  . ABDOMINAL SURGERY          Home Medications    Prior to Admission medications   Medication Sig Start Date End Date Taking? Authorizing Provider  amLODipine-benazepril (LOTREL) 10-40 MG capsule Take by mouth. 06/24/12   [provider]  atorvastatin (LIPITOR) 40 MG tablet Take by mouth. 10/04/17   [provider]  fluticasone (FLONASE) 50 MCG/ACT nasal spray 1 Spray by each nostril route Once a Day. 03/30/17   [provider]  furosemide (LASIX) 20 MG tablet Take by mouth. 02/10/18   [provider]  hydrochlorothiazide (HYDRODIURIL) 25 MG tablet TAKE 1 TABLET BY MOUTH DAILY 02/10/18   [provider]  insulin glargine (LANTUS) 100 UNIT/ML injection Inject into the skin. 10/04/17   [provider]  omeprazole  (PRILOSEC) 20 MG capsule Take by mouth. 07/15/12   [provider]  valsartan (DIOVAN) 160 MG tablet Take by mouth. 02/10/18   [provider]    Family History No family history on file.  Social History Social History   Tobacco Use  . Smoking status: Current Every Day Smoker    Packs/day: 0.50    Types: Cigarettes  . Smokeless tobacco: Never Used  Substance Use Topics  . Alcohol use: Not Currently  . Drug use: Not Currently     Allergies   Patient has no known allergies.   Review of Systems Review of Systems  Constitutional: Negative for fever.  HENT: Negative for sore throat.   Eyes: Negative for redness.  Respiratory: Negative for shortness of breath.   Cardiovascular: Negative for chest pain.  Gastrointestinal: Negative for abdominal pain and vomiting.  Endocrine: Positive for polydipsia and polyuria.  Genitourinary: Negative for dysuria and flank pain.  Musculoskeletal: Negative for back pain and neck pain.  Skin: Negative for rash.  Neurological: Negative for headaches.  Hematological: Does not bruise/bleed easily.  Psychiatric/Behavioral: Negative for confusion.     Physical Exam Updated Vital Signs BP 130/90 (BP Location: Right Arm)   Pulse (!) 103   Temp 99.6 F (37.6 C) (Oral)   Resp 18   Ht 1.956 m (6\' 5" )   Wt (!) 136.5 kg   SpO2 95%   BMI 35.69 kg/m   Physical Exam Vitals signs and nursing note reviewed.  Constitutional:      Appearance: Normal appearance. He is well-developed.  HENT:     Head: Atraumatic.     Nose: Nose normal.     Mouth/Throat:     Mouth: Mucous membranes are moist.     Pharynx: Oropharynx is clear.  Eyes:     General: No scleral icterus.    Conjunctiva/sclera: Conjunctivae normal.  Neck:     Musculoskeletal: Normal range of motion and neck supple. No neck rigidity.     Trachea: No tracheal deviation.  Cardiovascular:     Rate and Rhythm: Regular rhythm. Tachycardia present.     Pulses: Normal  pulses.     Heart sounds: Normal heart sounds. No murmur. No friction rub. No gallop.   Pulmonary:     Effort: Pulmonary effort is normal. No accessory muscle usage or respiratory distress.     Breath sounds: Normal breath sounds.  Abdominal:     General: Bowel sounds are normal. There is no distension.     Palpations: Abdomen is soft. There is no mass.     Tenderness: There is no abdominal tenderness. There is no guarding or rebound.     Hernia: No hernia is present.  Genitourinary:    Comments: No cva tenderness. Musculoskeletal:        General: No swelling.  Skin:    General: Skin is warm and dry.     Findings: No rash.  Neurological:     Mental Status: He is alert.     Comments: Alert, speech clear.   Psychiatric:        Mood and Affect: Mood normal.      ED Treatments / Results  Labs (all labs ordered are listed, but only abnormal results are displayed) Results for orders placed or performed during the hospital encounter of 09/19/18  CBC  Result Value Ref Range   WBC 10.4 4.0 - 10.5 K/uL   RBC 6.42 (H) 4.22 - 5.81 MIL/uL   Hemoglobin 16.8 13.0 - 17.0 g/dL   HCT 16.151.3 09.639.0 - 04.552.0 %   MCV 79.9 (L) 80.0 - 100.0 fL   MCH 26.2 26.0 - 34.0 pg   MCHC 32.7 30.0 - 36.0 g/dL   RDW 40.916.9 (H) 81.111.5 - 91.415.5 %   Platelets 186 150 - 400 K/uL   nRBC 0.0 0.0 - 0.2 %  Comprehensive metabolic panel  Result Value Ref Range   Sodium 131 (L) 135 - 145 mmol/L   Potassium 4.1 3.5 - 5.1 mmol/L   Chloride 90 (L) 98 - 111 mmol/L   CO2 21 (L) 22 - 32 mmol/L   Glucose, Bld 720 (HH) 70 - 99 mg/dL   BUN 27 (H) 6 - 20 mg/dL   Creatinine, Ser 7.821.69 (H) 0.61 - 1.24 mg/dL   Calcium 95.610.6 (H) 8.9 - 10.3 mg/dL   Total Protein 9.0 (H) 6.5 - 8.1 g/dL   Albumin 4.5 3.5 - 5.0 g/dL   AST 26 15 - 41 U/L   ALT 36 0 - 44 U/L   Alkaline Phosphatase 152 (H) 38 - 126 U/L   Total Bilirubin 1.4 (H) 0.3 - 1.2 mg/dL   GFR calc non Af Amer 44 (L) >60 mL/min   GFR calc Af Amer 51 (L) >60 mL/min   Anion gap 20  (H) 5 - 15  Urinalysis, Routine w reflex microscopic  Result Value Ref Range   Color, Urine YELLOW YELLOW   APPearance CLEAR CLEAR   Specific Gravity, Urine 1.015 1.005 -  1.030   pH 6.0 5.0 - 8.0   Glucose, UA >=500 (A) NEGATIVE mg/dL   Hgb urine dipstick LARGE (A) NEGATIVE   Bilirubin Urine NEGATIVE NEGATIVE   Ketones, ur 15 (A) NEGATIVE mg/dL   Protein, ur 30 (A) NEGATIVE mg/dL   Nitrite NEGATIVE NEGATIVE   Leukocytes,Ua NEGATIVE NEGATIVE  Urinalysis, Microscopic (reflex)  Result Value Ref Range   RBC / HPF 11-20 0 - 5 RBC/hpf   WBC, UA 0-5 0 - 5 WBC/hpf   Bacteria, UA FEW (A) NONE SEEN   Squamous Epithelial / LPF 0-5 0 - 5  POC CBG, ED  Result Value Ref Range   Glucose-Capillary >600 (HH) 70 - 99 mg/dL    EKG None  Radiology No results found.  Procedures Procedures (including critical care time)  Medications Ordered in ED Medications  dextrose 5 %-0.45 % sodium chloride infusion (has no administration in time range)  insulin regular, human (MYXREDLIN) 100 units/ 100 mL infusion (has no administration in time range)  sodium chloride 0.9 % bolus 1,000 mL (0 mLs Intravenous Stopping Infusion hung by another clincian 09/19/18 1345)  sodium chloride 0.9 % bolus 1,000 mL (1,000 mLs Intravenous New Bag/Given 09/19/18 1346)     Initial Impression / Assessment and Plan / ED Course  I have reviewed the triage vital signs and the nursing notes.  Pertinent labs & imaging results that were available during my care of the patient were reviewed by me and considered in my medical decision making (see chart for details).  Iv ns bolus. Stat labs.  Reviewed nursing notes and prior charts for additional history.   Labs reviewed by me - glucose very high, 720. hco3 mildly low, 21.  Iv ns boluses. Insulin gtt via glucostabilizer.  covid is pending. No cough or fever.  [facility] - they indicate no tele,stepdown or icu bed, so cannot accept.    [facility] - discussed with Dr Lowell GuitarPowell who [facility]. He requests we add lactate to workup - added.   Additional ns iv.  CRITICAL CARE Performed by: Suzi RootsKevin E Marshay Slates Total critical care time: 40 minutes Critical care time was exclusive of separately billable procedures and treating other patients. Critical care was necessary to treat or prevent imminent or life-threatening deterioration. Critical care was time spent personally by me on the following activities: development of treatment plan with patient and/or surrogate as well as nursing, discussions with consultants, evaluation of patient's response to treatment, examination of patient, obtaining history from patient or surrogate, ordering and performing treatments and interventions, ordering and review of laboratory studies, ordering and review of radiographic studies, pulse oximetry and re-evaluation of patient's condition.  Now pt states not willing to be admitted. Requests ivf, meds in ED here, and d/c from ED.  Discussed risks including severe dehydration, worsening symptoms, severe illness, need for icu care, etc - pt voices understanding, and refuses admission.  Signed out to Dr Madilyn Hookees to recheck bmet, recheck pt, and dispo appropriately.     Final Clinical Impressions(s) / ED Diagnoses   Final diagnoses:  None    ED Discharge Orders    None           Cathren LaineSteinl, Sharrieff Spratlin, MD 09/19/18 1554

## 2018-09-19 NOTE — Treatment Plan (Signed)
58 yo with IDDM presenting with hyperglycemia and malaise, polyuria, polydipsia.  No infectious sx per EDP.  Found to be hyperglycemia with AGMA and ketones in urine.  Labs notable for hyperglycemia, CO2 21, Cr 1.69 (appears around baseline), hypercalcemia, mild hyperbili.  UA with RBC's and glucose and 30 mg/dl protein.  CXR without acute findings.  Plan for transfer to [facility] stepdown for treatment of DKA.  Requested COVID 19 screening test and lactic acid prior to transfer.

## 2019-12-19 ENCOUNTER — Other Ambulatory Visit (HOSPITAL_BASED_OUTPATIENT_CLINIC_OR_DEPARTMENT_OTHER): Payer: Self-pay | Admitting: Emergency Medicine

## 2019-12-19 ENCOUNTER — Emergency Department (HOSPITAL_BASED_OUTPATIENT_CLINIC_OR_DEPARTMENT_OTHER)
Admission: EM | Admit: 2019-12-19 | Discharge: 2019-12-19 | Disposition: A | Discharge status: Home / Self Care | Payer: Medicare Other | Attending: Emergency Medicine | Admitting: Emergency Medicine

## 2019-12-19 ENCOUNTER — Other Ambulatory Visit: Payer: Self-pay

## 2019-12-19 ENCOUNTER — Encounter (HOSPITAL_BASED_OUTPATIENT_CLINIC_OR_DEPARTMENT_OTHER): Payer: Self-pay | Admitting: *Deleted

## 2019-12-19 DIAGNOSIS — Z794 Long term (current) use of insulin: Secondary | ICD-10-CM | POA: Insufficient documentation

## 2019-12-19 DIAGNOSIS — Z79899 Other long term (current) drug therapy: Secondary | ICD-10-CM | POA: Diagnosis not present

## 2019-12-19 DIAGNOSIS — E1110 Type 2 diabetes mellitus with ketoacidosis without coma: Secondary | ICD-10-CM | POA: Diagnosis not present

## 2019-12-19 DIAGNOSIS — I10 Essential (primary) hypertension: Secondary | ICD-10-CM | POA: Diagnosis not present

## 2019-12-19 DIAGNOSIS — F17210 Nicotine dependence, cigarettes, uncomplicated: Secondary | ICD-10-CM | POA: Diagnosis not present

## 2019-12-19 DIAGNOSIS — M545 Low back pain, unspecified: Secondary | ICD-10-CM | POA: Insufficient documentation

## 2019-12-19 MED ORDER — HYDROCODONE-ACETAMINOPHEN 5-325 MG PO TABS: 1.0000 | ORAL_TABLET | Freq: Four times a day (QID) | ORAL | 0 refills | Status: DC | PRN

## 2019-12-19 MED FILL — HYDROCODON-APAP 5-325: 5-325 | 3 days supply | Qty: 10 | Fill #0

## 2019-12-19 NOTE — Discharge Instructions (Signed)
Take the hydrocodone as directed to help provide some back pain relief.  Rest is much as possible.  Follow-up with your primary care doctor.  Symptoms persist beyond 2 weeks further evaluation needed or follow-up back with pain management.  Return for any new or worse symptoms.

## 2019-12-19 NOTE — ED Triage Notes (Signed)
Lumbar back pain x 2 days. Denies known injury. Increased pain with movement.

## 2019-12-19 NOTE — ED Provider Notes (Signed)
[facility] EMERGENCY DEPARTMENT Provider Note   CSN: [id number] Arrival date & time: 12/19/19  1515     History Chief Complaint  Patient presents with  . Back Pain    Brett Barnes is a 59 y.o. male.  Patient with 3-day complaint of left lumbar back pain.  No known injury.  No radiation of pain into the leg or or foot weakness or numbness.  Patient with a past history of some chronic back pain.  Last seen by pain management in January.  Patient had a stimulator.  Patient states he has been pain-free since that time.  He was not following up with pain management.        Past Medical History:  Diagnosis Date  . Diabetes mellitus without complication ([facility])   . Hypertension     Patient Active Problem List   Diagnosis Date Noted  . DKA (diabetic ketoacidoses) 09/19/2018    Past Surgical History:  Procedure Laterality Date  . ABDOMINAL SURGERY         History reviewed. No pertinent family history.  Social History   Tobacco Use  . Smoking status: Current Every Day Smoker    Packs/day: 0.50    Types: Cigarettes  . Smokeless tobacco: Never Used  Vaping Use  . Vaping Use: Never used  Substance Use Topics  . Alcohol use: Not Currently  . Drug use: Not Currently    Home Medications Prior to Admission medications   Medication Sig Start Date End Date Taking? Authorizing Provider  amLODipine-benazepril (LOTREL) 10-40 MG capsule Take by mouth. 06/24/12   [provider]  atorvastatin (LIPITOR) 40 MG tablet Take by mouth. 10/04/17   [provider]  fluticasone (FLONASE) 50 MCG/ACT nasal spray 1 Spray by each nostril route Once a Day. 03/30/17   [provider]  furosemide (LASIX) 20 MG tablet Take by mouth. 02/10/18   [provider]  hydrochlorothiazide (HYDRODIURIL) 25 MG tablet TAKE 1 TABLET BY MOUTH DAILY 02/10/18   [provider]  HYDROcodone-acetaminophen (NORCO/VICODIN) 5-325 MG tablet Take 1 tablet by mouth  every 6 (six) hours as needed for moderate pain. 12/19/19   Vanetta Mulders, MD  insulin glargine (LANTUS) 100 UNIT/ML injection Inject into the skin. 10/04/17   [provider]  omeprazole (PRILOSEC) 20 MG capsule Take by mouth. 07/15/12   [provider]  valsartan (DIOVAN) 160 MG tablet Take by mouth. 02/10/18   [provider]    Allergies    Patient has no known allergies.  Review of Systems   Review of Systems  Constitutional: Negative for chills and fever.  HENT: Negative for congestion, rhinorrhea and sore throat.   Eyes: Negative for visual disturbance.  Respiratory: Negative for cough and shortness of breath.   Cardiovascular: Negative for chest pain and leg swelling.  Gastrointestinal: Negative for abdominal pain, diarrhea, nausea and vomiting.  Genitourinary: Negative for dysuria.  Musculoskeletal: Positive for back pain. Negative for neck pain.  Skin: Negative for rash.  Neurological: Negative for dizziness, weakness, light-headedness, numbness and headaches.  Hematological: Does not bruise/bleed easily.  Psychiatric/Behavioral: Negative for confusion.    Physical Exam Updated Vital Signs BP (!) 149/109 (BP Location: Left Arm)   Pulse (!) 105   Temp 98.6 F (37 C) (Oral)   Resp 16   Ht 1.956 m (6\' 5" )   Wt (!) 145.4 kg   SpO2 98%   BMI 38.01 kg/m   Physical Exam Vitals and nursing note reviewed.  Constitutional:  Appearance: Normal appearance. He is well-developed.  HENT:     Head: Normocephalic and atraumatic.  Eyes:     Extraocular Movements: Extraocular movements intact.     Conjunctiva/sclera: Conjunctivae normal.     Pupils: Pupils are equal, round, and reactive to light.  Cardiovascular:     Rate and Rhythm: Normal rate and regular rhythm.     Heart sounds: No murmur heard.   Pulmonary:     Effort: Pulmonary effort is normal. No respiratory distress.     Breath sounds: Normal breath sounds.  Abdominal:      Palpations: Abdomen is soft.     Tenderness: There is no abdominal tenderness.  Musculoskeletal:     Cervical back: Normal range of motion and neck supple.     Comments: Decreased range of motion in the lumbar area secondary to pain.  Skin:    General: Skin is warm and dry.     Capillary Refill: Capillary refill takes less than 2 seconds.  Neurological:     General: No focal deficit present.     Mental Status: He is alert and oriented to person, place, and time.     ED Results / Procedures / Treatments   Labs (all labs ordered are listed, but only abnormal results are displayed) Labs Reviewed - No data to display  EKG None  Radiology No results found.  Procedures Procedures (including critical care time)  Medications Ordered in ED Medications - No data to display  ED Course  I have reviewed the triage vital signs and the nursing notes.  Pertinent labs & imaging results that were available during my care of the patient were reviewed by me and considered in my medical decision making (see chart for details).    MDM Rules/Calculators/A&P                          No evidence of any sciatica or focal neuro deficit related to the back pain.  No fall or injury.  Symptoms only been present for a few days.  So no need for imaging at this time.  However symptoms persist beyond 2 weeks then it would be appropriate to consider either x-ray or CT of the back if there is any neurological deficit at that time an MRI would be appropriate.  Patient will be treated with pain medication rest follow-up with primary care doctor and consideration for following back up to pain management.    Final Clinical Impression(s) / ED Diagnoses Final diagnoses:  Acute left-sided low back pain without sciatica    Rx / DC Orders ED Discharge Orders         Ordered    HYDROcodone-acetaminophen (NORCO/VICODIN) 5-325 MG tablet  Every 6 hours PRN        12/19/19 1606           Vanetta Mulders,  MD 12/19/19 1610

## 2020-04-26 IMAGING — DX PORTABLE CHEST - 1 VIEW
1 series · 1 of 1 positions shown · non-contrast
Comparison: None.

CLINICAL DATA: Fever and weakness.

EXAM:
PORTABLE CHEST 1 VIEW

[chest ap]
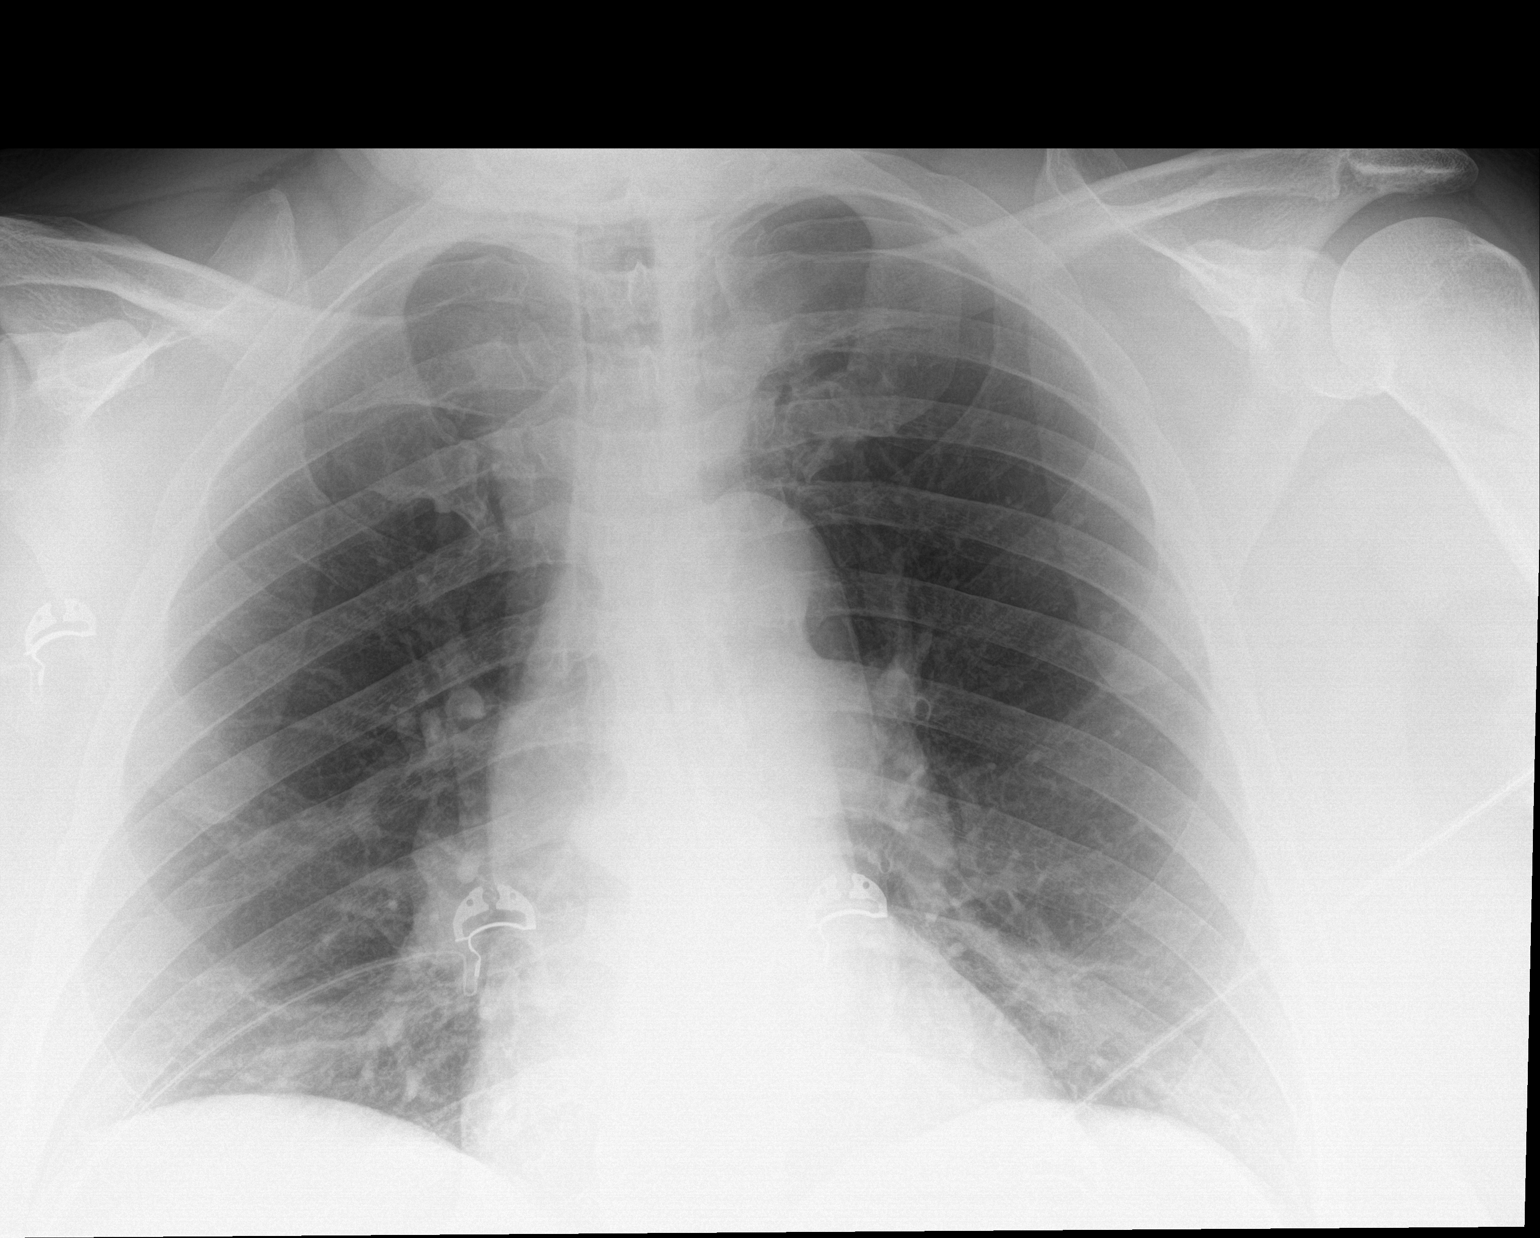

[1 of 1 positions shown; findings below may reference images not displayed]

FINDINGS: Bibasilar atelectasis. The lungs are otherwise clear. Normal
cardiomediastinal contours. No pleural effusion or pneumothorax.
IMPRESSION: No active disease.
